# Patient Record
Sex: Male | Born: 1945 | Race: Black or African American | Hispanic: No | Marital: Married | Smoking: Former smoker
Health system: Southern US, Community
[De-identification: ages and names within clinical notes are randomized; demographics above are authoritative.]

## PROBLEM LIST (undated history)

## (undated) DIAGNOSIS — I2510 Atherosclerotic heart disease of native coronary artery without angina pectoris: Secondary | ICD-10-CM

## (undated) DIAGNOSIS — M1990 Unspecified osteoarthritis, unspecified site: Secondary | ICD-10-CM

## (undated) DIAGNOSIS — I6529 Occlusion and stenosis of unspecified carotid artery: Secondary | ICD-10-CM

## (undated) DIAGNOSIS — I10 Essential (primary) hypertension: Secondary | ICD-10-CM

## (undated) DIAGNOSIS — E119 Type 2 diabetes mellitus without complications: Secondary | ICD-10-CM

## (undated) DIAGNOSIS — K219 Gastro-esophageal reflux disease without esophagitis: Secondary | ICD-10-CM

## (undated) DIAGNOSIS — M545 Low back pain, unspecified: Secondary | ICD-10-CM

## (undated) DIAGNOSIS — I219 Acute myocardial infarction, unspecified: Secondary | ICD-10-CM

## (undated) DIAGNOSIS — E7800 Pure hypercholesterolemia, unspecified: Secondary | ICD-10-CM

## (undated) DIAGNOSIS — G8929 Other chronic pain: Secondary | ICD-10-CM

## (undated) HISTORY — DX: Occlusion and stenosis of unspecified carotid artery: I65.29

## (undated) SURGERY — Surgical Case
Anesthesia: *Unknown

---

## 1999-02-13 ENCOUNTER — Ambulatory Visit (HOSPITAL_COMMUNITY): Admission: RE | Admit: 1999-02-13 | Discharge: 1999-02-13 | Payer: Self-pay | Admitting: Cardiology

## 1999-02-13 ENCOUNTER — Encounter: Payer: Self-pay | Admitting: Cardiology

## 2003-04-25 ENCOUNTER — Encounter: Payer: Self-pay | Admitting: Emergency Medicine

## 2003-04-25 ENCOUNTER — Emergency Department (HOSPITAL_COMMUNITY): Admission: EM | Admit: 2003-04-25 | Discharge: 2003-04-25 | Payer: Self-pay | Admitting: Emergency Medicine

## 2003-04-27 ENCOUNTER — Encounter: Payer: Self-pay | Admitting: Cardiology

## 2003-04-27 ENCOUNTER — Encounter: Admission: RE | Admit: 2003-04-27 | Discharge: 2003-04-27 | Payer: Self-pay | Admitting: Cardiology

## 2004-07-04 ENCOUNTER — Encounter: Admission: RE | Admit: 2004-07-04 | Discharge: 2004-07-04 | Payer: Self-pay | Admitting: Cardiology

## 2005-04-24 ENCOUNTER — Ambulatory Visit (HOSPITAL_COMMUNITY): Admission: RE | Admit: 2005-04-24 | Discharge: 2005-04-24 | Payer: Self-pay | Admitting: Cardiology

## 2005-12-05 ENCOUNTER — Encounter: Admission: RE | Admit: 2005-12-05 | Discharge: 2005-12-05 | Payer: Self-pay | Admitting: Internal Medicine

## 2006-07-07 DIAGNOSIS — I219 Acute myocardial infarction, unspecified: Secondary | ICD-10-CM

## 2006-07-07 HISTORY — PX: CORONARY ANGIOPLASTY WITH STENT PLACEMENT: SHX49

## 2006-07-07 HISTORY — DX: Acute myocardial infarction, unspecified: I21.9

## 2006-10-28 ENCOUNTER — Encounter: Admission: RE | Admit: 2006-10-28 | Discharge: 2006-10-28 | Payer: Self-pay | Admitting: Internal Medicine

## 2006-12-25 ENCOUNTER — Ambulatory Visit (HOSPITAL_COMMUNITY): Admission: RE | Admit: 2006-12-25 | Discharge: 2006-12-26 | Payer: Self-pay | Admitting: Cardiovascular Disease

## 2010-11-19 NOTE — Discharge Summary (Signed)
NAME:  Austin Mclaughlin, Austin Mclaughlin NO.:  [id number]   MEDICAL RECORD NO.:  [id number]          PATIENT TYPE:  OIB   LOCATION:  6531                         FACILITY:  [facility]   PHYSICIAN:  Nanetta Batty, M.D.   DATE OF BIRTH:  August 02, 1945   DATE OF ADMISSION:  12/25/2006  DATE OF DISCHARGE:  12/26/2006                               DISCHARGE SUMMARY   DISCHARGE DIAGNOSES:  1. Abnormal Cardiolite study, coronary disease with elective obtuse      marginal-2 TAXUS stenting this admission.  2. Treated hypertension.  3. Non-insulin diabetes.  4. Dyslipidemia.   HOSPITAL COURSE:  The patient is a 65 year old male who had a positive  functional study in May 2008, after chest pain.  He has multiple risk  factors for coronary disease.  He was cathed as an outpatient on June  6th, which showed normal LV function, total PDA which filled by  collaterals, and high-grade OM-2 disease.  He was admitted on December 25, 2006, for elective OM-2 intervention.  This was done with a TAXUS stent  with good results.  He also had an AV groove intervention.  Dr. Allyson Sabal  feels he can be discharged December 26, 2006.   DISCHARGE MEDICATIONS:  1. Plavix 75 mg a day.  2. Aspirin 81 mg a day.  3. Simvastatin 40 mg a day.  4. Lisinopril/HCTZ 20/12.5 daily.  5. Actos 45 mg a day.  6. Lantus 10 units a day.  7. Nitroglycerin sublingual p.r.n.  8. He will start his glyburide/metformin 5/500, two tablets b.i.d. on      December 28, 2006.   LABORATORY:  White count 5.1, hemoglobin 12.1, hematocrit 35.1,  platelets 287.  Sodium 141, potassium 3.9, BUN 15, creatinine 0.9.  CK-  MB and troponin are negative x1.   DISPOSITION:  The patient is discharged in stable condition and will  follow up with Dr. Allyson Sabal as an outpatient.      Abelino Derrick, P.A.      Nanetta Batty, M.D.  Electronically Signed    LKK/MEDQ  D:  12/26/2006  T:  12/26/2006  Job:  [id number]

## 2010-11-19 NOTE — Discharge Summary (Signed)
NAME:  Austin Mclaughlin, Austin Mclaughlin                   ACCOUNT NO.:  [id number]   MEDICAL RECORD NO.:  [id number]          PATIENT TYPE:  OIB   LOCATION:  6531                         FACILITY:  [facility]   PHYSICIAN:  Abelino Derrick, P.A.   DATE OF BIRTH:  12/19/1945   DATE OF ADMISSION:  12/25/2006  DATE OF DISCHARGE:                               DISCHARGE SUMMARY   Audio too short to transcribe (less than 5 seconds)      Luke K. Kilroy, P.ALenard Lance  D:  12/26/2006  T:  12/26/2006  Job:  [id number]

## 2010-11-19 NOTE — Cardiovascular Report (Signed)
NAME:  TALIK, CASIQUE NO.:  [id number]   MEDICAL RECORD NO.:  [id number]          PATIENT TYPE:  OIB   LOCATION:  6531                         FACILITY:  [facility]   PHYSICIAN:  Nanetta Batty, M.D.   DATE OF BIRTH:  1945-08-22   DATE OF PROCEDURE:  DATE OF DISCHARGE:                            CARDIAC CATHETERIZATION   Mr. Harlin is a 65 year old mildly overweight African-American male with a  positive functional study, Nov 19, 2006, after an episode of chest  discomfort.  His other problems include recently discontinuing tobacco  abuse, hypertension, hyperlipidemia and diabetes.  His functional study  showed inferolateral ischemia.  Cath performed as an outpatient December 11, 2006 showed a left dominant system, normal LV function and an occluded  PDA with, collaterals, high-grade OM-2 disease and proximal segmental AV  groove circumflex disease.  He has been asymptomatic.  He presents now  for elective PCI for __________ ischemia.   DESCRIPTION OF PROCEDURE:  The patient was brought to the second floor  of [place] cardiac cath lab in post-absorptive state.  He was pulse  pre-medicated with p.o. Valium.   His right groin was prepped and shaved in the usual sterile fashion.  One percent Xylocaine was used for local anesthesia.  A 7-French sheath  was inserted into the right femoral artery, using standard Seldinger  technique.  A Doppler-tipped Smart needle was required.  The patient  received 4 baby aspirin.  He was on 75 mg of Plavix at home and received  an additional 300 mg of Plavix, prior intervention, as well as Angiomax  bolus with an ACT of approximately 300.   Using a 7-French XD 3.5 guide catheter, along with an OM4 190 Asahi soft  wire, a 2.5 15 Maverick PTCA was performed of the proximal OM-2.  It  should be noted that the 2-0 cutting balloon was unable to cross this  lesion, nor was a 2.5 stent prior to dilatation with a 2.5 balloon.  After dilatation  with 2.5 15 Maverick, a 2.5 16 Taxus drug-eluting stent  was then deployed under careful angiographic and fluoroscopic control at  the ostium of OM-2, at 12 atmospheres (2.7 mm), resulting in reduction  of 95% stenosis with 0% residual, without dissection.  There was TIMI-  III flow.  Intracoronary nitroglycerin 200 mg was administered.  Following this, direct stenting was performed of the proximal segmental  AV groove circumflex, with 3-0 20 Taxus at 16 atmospheres (3.34),  resulting in reduction of a long 70 to 80% stenosis to a 0% residual.  The patient tolerated the procedure well.  There was subtle ST-segment  elevation with balloon inflation, which resolved quickly with balloon  deflation.  There are no hemodynamic or arrhythmogenic sequela.   OVERALL IMPRESSION:  Successful PCI and stenting of the AV groove  circumflex and OM II using Taxus drug-eluting stent and Angiomax.  The  patient tolerated the procedure well.  Guidewire and catheter removed.  The sheath  was sewn securely in place.  The patient left the lab in stable  condition.  The  sheath will be removed in 2 hours.  The patient will be  discharged home the morning with aspirin and Plavix, will see me back in  the office in 1 or 2 weeks at followup.  Dr. Jacqulyn Bath office was  notified.      Nanetta Batty, M.D.  Electronically Signed     JB/MEDQ  D:  12/25/2006  T:  12/25/2006  Job:  [id number]   cc:   Patient Chart  [facility] - 2nd Floor  [facility]  Ralene Ok, M.D.

## 2011-04-23 LAB — CBC
HCT: 35.1 — ABNORMAL LOW
Hemoglobin: 12 — ABNORMAL LOW
MCHC: 34.1
MCV: 91.5
Platelets: 287
RBC: 3.84 — ABNORMAL LOW
RDW: 13.2
WBC: 5.1

## 2011-04-23 LAB — BASIC METABOLIC PANEL
BUN: 15
CO2: 25
Calcium: 8.9
Chloride: 108
Creatinine, Ser: 0.93
GFR calc Af Amer: >60
GFR calc non Af Amer: >60
Glucose, Bld: 157 — ABNORMAL HIGH
Potassium: 3.9
Sodium: 141

## 2011-04-23 LAB — TROPONIN I: Troponin I: 0.03

## 2011-04-23 LAB — CK TOTAL AND CKMB (NOT AT ARMC)
CK, MB: 3.1
Relative Index: 1.3
Total CK: 248 — ABNORMAL HIGH

## 2013-02-15 ENCOUNTER — Inpatient Hospital Stay (HOSPITAL_COMMUNITY)
Admission: EM | Admit: 2013-02-15 | Discharge: 2013-02-17 | DRG: 282 | Disposition: A | Discharge status: Home / Self Care | Payer: Medicare Other | Attending: Cardiovascular Disease | Admitting: Cardiovascular Disease

## 2013-02-15 ENCOUNTER — Encounter (HOSPITAL_COMMUNITY): Payer: Self-pay

## 2013-02-15 ENCOUNTER — Emergency Department (HOSPITAL_COMMUNITY): Payer: Medicare Other

## 2013-02-15 DIAGNOSIS — I213 ST elevation (STEMI) myocardial infarction of unspecified site: Secondary | ICD-10-CM

## 2013-02-15 DIAGNOSIS — E119 Type 2 diabetes mellitus without complications: Secondary | ICD-10-CM | POA: Diagnosis present

## 2013-02-15 DIAGNOSIS — Z720 Tobacco use: Secondary | ICD-10-CM | POA: Diagnosis present

## 2013-02-15 DIAGNOSIS — R079 Chest pain, unspecified: Secondary | ICD-10-CM | POA: Diagnosis present

## 2013-02-15 DIAGNOSIS — I2109 ST elevation (STEMI) myocardial infarction involving other coronary artery of anterior wall: Principal | ICD-10-CM | POA: Diagnosis present

## 2013-02-15 DIAGNOSIS — I2510 Atherosclerotic heart disease of native coronary artery without angina pectoris: Secondary | ICD-10-CM | POA: Diagnosis present

## 2013-02-15 DIAGNOSIS — F17200 Nicotine dependence, unspecified, uncomplicated: Secondary | ICD-10-CM | POA: Diagnosis present

## 2013-02-15 DIAGNOSIS — Z9861 Coronary angioplasty status: Secondary | ICD-10-CM

## 2013-02-15 DIAGNOSIS — I10 Essential (primary) hypertension: Secondary | ICD-10-CM | POA: Diagnosis present

## 2013-02-15 HISTORY — DX: Other chronic pain: G89.29

## 2013-02-15 HISTORY — DX: Low back pain: M54.5

## 2013-02-15 HISTORY — DX: Essential (primary) hypertension: I10

## 2013-02-15 HISTORY — DX: Acute myocardial infarction, unspecified: I21.9

## 2013-02-15 HISTORY — DX: Pure hypercholesterolemia, unspecified: E78.00

## 2013-02-15 HISTORY — DX: Low back pain, unspecified: M54.50

## 2013-02-15 HISTORY — PX: CARDIAC CATHETERIZATION: SHX172

## 2013-02-15 HISTORY — DX: Gastro-esophageal reflux disease without esophagitis: K21.9

## 2013-02-15 HISTORY — DX: Unspecified osteoarthritis, unspecified site: M19.90

## 2013-02-15 HISTORY — DX: Type 2 diabetes mellitus without complications: E11.9

## 2013-02-15 HISTORY — DX: Atherosclerotic heart disease of native coronary artery without angina pectoris: I25.10

## 2013-02-15 LAB — POCT I-STAT TROPONIN I: Troponin i, poc: 0.02 ng/mL (ref 0.00–0.08)

## 2013-02-15 LAB — CBC
HCT: 37.5 % — ABNORMAL LOW (ref 39.0–52.0)
Hemoglobin: 13.4 g/dL (ref 13.0–17.0)
MCH: 32.1 pg (ref 26.0–34.0)
MCV: 89.7 fL (ref 78.0–100.0)
Platelets: 280 10*3/uL (ref 150–400)
RBC: 4.18 MIL/uL — ABNORMAL LOW (ref 4.22–5.81)
RDW: 12.3 % (ref 11.5–15.5)
WBC: 5.3 10*3/uL (ref 4.0–10.5)

## 2013-02-15 LAB — BASIC METABOLIC PANEL
CO2: 28 mEq/L (ref 19–32)
Calcium: 9.9 mg/dL (ref 8.4–10.5)
Chloride: 95 mEq/L — ABNORMAL LOW (ref 96–112)
Creatinine, Ser: 1.24 mg/dL (ref 0.50–1.35)
GFR calc Af Amer: 67 mL/min — ABNORMAL LOW (ref >90)
GFR calc non Af Amer: 58 mL/min — ABNORMAL LOW (ref >90)
Glucose, Bld: 236 mg/dL — ABNORMAL HIGH (ref 70–99)
Potassium: 3.4 mEq/L — ABNORMAL LOW (ref 3.5–5.1)
Sodium: 133 mEq/L — ABNORMAL LOW (ref 135–145)

## 2013-02-15 SURGERY — LEFT HEART CATHETERIZATION WITH CORONARY ANGIOGRAM
Anesthesia: LOCAL

## 2013-02-15 MED ORDER — METOPROLOL TARTRATE 1 MG/ML IV SOLN
2.5000 mg | Freq: Once | INTRAVENOUS | Status: AC
  Administered 2013-02-15: 2.5 mg via INTRAVENOUS

## 2013-02-15 MED ORDER — HEPARIN SODIUM (PORCINE) 5000 UNIT/ML IJ SOLN
INTRAMUSCULAR | Status: AC
  Administered 2013-02-15: 5000 [IU]
  Filled 2013-02-15: qty 1

## 2013-02-15 MED ORDER — ASPIRIN 81 MG PO CHEW
324.0000 mg | CHEWABLE_TABLET | Freq: Once | ORAL | Status: DC
  Filled 2013-02-15: qty 4

## 2013-02-15 MED ORDER — POTASSIUM CHLORIDE CRYS ER 20 MEQ PO TBCR
40.0000 meq | EXTENDED_RELEASE_TABLET | Freq: Once | ORAL | Status: AC
  Administered 2013-02-16: 40 meq via ORAL

## 2013-02-15 MED ORDER — DILTIAZEM HCL 25 MG/5ML IV SOLN: 15.0000 mg | Freq: Once | INTRAVENOUS | Status: DC

## 2013-02-15 MED ORDER — METOPROLOL TARTRATE 1 MG/ML IV SOLN
INTRAVENOUS | Status: AC
  Filled 2013-02-15: qty 15

## 2013-02-15 MED ORDER — ASPIRIN 325 MG PO TABS
325.0000 mg | ORAL_TABLET | ORAL | Status: AC
  Administered 2013-02-15: 325 mg via ORAL

## 2013-02-15 NOTE — ED Notes (Signed)
Pt presents with no complaints of pain from [facility] via [facility]

## 2013-02-15 NOTE — ED Notes (Signed)
Pt reports an onset of chest pain tonight located on the L side that he describes as sharp. Non radiating. Pt denies jaw pain, neck pain or arm pain. Hx of 2 CABG. Pain to chest area is intermittent. VS stable. NSR on monitor.

## 2013-02-15 NOTE — H&P (Signed)
Admit date: 02/15/2013 Referring Physician Dr. Rulon Abide Primary Cardiologist Dr. Allyson Sabal Chief complaint/reason for admission: Chest pain  HPI: This is a 67yo AAM with a history of CAD s/p cath in 2008 after a stress test for CP showed ischemia.  Cath with occluded PDA and collaterals with high grade OM2 disease and proximal segmental AV groove left circ disease.  He underwent PCI of the AV groove left circ and OM2 with DES.  He has done well since then until yesterday when he started having CP.  The CP has been intermittent for the past 24 hours and then today he developed recurrent pain and decided to go to the ER.  He described the pain as a sharp stinging pain in his left chest with no SOB, nausea or diaphoresis. At [facility] ER he was noted to have 4mm of ST elevation in V1 and V2 and a STEMI was called and he was transferred to [facility].  He currently is pain free.    PMH:    Past Medical History  Diagnosis Date  . Hypertension   . Coronary artery disease with cath 2008 with occluded PDA with collaterals and high grade OM2 disease and proximal segmental AV groove left circ disease s/p DES to AV groove left circ and OM2   . Diabetes mellitus without complication     PSH:    Past Surgical History  Procedure Laterality Date  . Coronary artery bypass graft      ALLERGIES:   Review of patient's allergies indicates not on file.  Prior to Admit Meds:   (Not in a hospital admission) Family HX:   No family history on file. Social HX:    History   Social History  . Marital Status: Married    Spouse Name: [name]    Number of Children: [name]  . Years of Education: [name]   Occupational History  . Not on file.   Social History Main Topics  . Smoking status: Current Every Day Smoker    Types: Cigarettes  . Smokeless tobacco: Not on file  . Alcohol Use: Not on file  . Drug Use: No  . Sexually Active: Not on file   Other Topics Concern  . Not on file   Social History Narrative  . No narrative on file      ROS:  All 11 ROS were addressed and are negative except what is stated in the HPI  PHYSICAL EXAM Filed Vitals:   02/15/13 2331  Pulse: 93  Temp: 99.1 F (37.3 C)  Resp: 17   General: Well developed, well nourished, in no acute distress Head: Eyes PERRLA, No xanthomas.   Normal cephalic and atramatic  Lungs:   Clear bilaterally to auscultation and percussion. Heart:   HRRR S1 S2 Pulses are 2+ & equal.            No carotid bruit. No JVD.  No abdominal bruits. No femoral bruits. Abdomen: Bowel sounds are positive, abdomen soft and non-tender without masses Extremities:   No clubbing, cyanosis or edema.  DP trace Neuro: Alert and oriented X 3. Psych:  Good affect, responds appropriately   Labs:   Lab Results  Component Value Date   WBC 5.3 02/15/2013   HGB 13.4 02/15/2013   HCT 37.5* 02/15/2013   MCV 89.7 02/15/2013   PLT 280 02/15/2013    Recent Labs Lab 02/15/13 2314  NA 133*  K 3.4*  CL 95*  CO2 28  BUN 16  CREATININE 1.24  CALCIUM  9.9  GLUCOSE 236*   Lab Results  Component Value Date   CKTOTAL 248* 12/25/2006   CKMB 3.1 12/25/2006   TROPONINI  Value: 0.03        NO INDICATION OF MYOCARDIAL INJURY. 12/25/2006       Radiology:  @[id number] @  EKG:  NSR with 2-68mm of ST elevation in V2 and 1mm in V1  ASSESSMENT:  1.  STEMI with EKG showing ST elevation in V1 and V2 - he has had stuttering CP for the past 24 hours off and on. 2.  CAD with remote PCI of the left circ and OM2 3.  DM 4.  HTN with poorly controlled BP  PLAN:   1.  Admit to CCU 2.  Cycle cardiac enzymes 3.  IV Heparin gtt/ASA/statin 4.  Lopressor 5mg  IV given in ER and will continue PO 5.  Emergent cath by Dr. Rosamaria Lints, MD  02/15/2013  11:50 PM

## 2013-02-15 NOTE — ED Notes (Signed)
EKG shown to Cardiologist at 23:47

## 2013-02-16 ENCOUNTER — Ambulatory Visit (HOSPITAL_COMMUNITY): Admit: 2013-02-16 | Payer: Self-pay | Admitting: Cardiovascular Disease

## 2013-02-16 ENCOUNTER — Encounter (HOSPITAL_COMMUNITY): Payer: Self-pay | Admitting: General Practice

## 2013-02-16 DIAGNOSIS — I2510 Atherosclerotic heart disease of native coronary artery without angina pectoris: Secondary | ICD-10-CM | POA: Diagnosis present

## 2013-02-16 DIAGNOSIS — I10 Essential (primary) hypertension: Secondary | ICD-10-CM | POA: Diagnosis present

## 2013-02-16 DIAGNOSIS — Z720 Tobacco use: Secondary | ICD-10-CM | POA: Diagnosis present

## 2013-02-16 DIAGNOSIS — E119 Type 2 diabetes mellitus without complications: Secondary | ICD-10-CM | POA: Diagnosis present

## 2013-02-16 DIAGNOSIS — R079 Chest pain, unspecified: Secondary | ICD-10-CM | POA: Diagnosis present

## 2013-02-16 DIAGNOSIS — I214 Non-ST elevation (NSTEMI) myocardial infarction: Secondary | ICD-10-CM

## 2013-02-16 LAB — POCT I-STAT TROPONIN I: Troponin i, poc: 0.01 ng/mL (ref 0.00–0.08)

## 2013-02-16 LAB — CBC WITH DIFFERENTIAL/PLATELET
Eosinophils Absolute: 0.1 10*3/uL (ref 0.0–0.7)
Eosinophils Relative: 2 % (ref 0–5)
HCT: 34.8 % — ABNORMAL LOW (ref 39.0–52.0)
Lymphs Abs: 2.1 10*3/uL (ref 0.7–4.0)
MCH: 32.1 pg (ref 26.0–34.0)
MCV: 89.5 fL (ref 78.0–100.0)
Monocytes Absolute: 0.4 10*3/uL (ref 0.1–1.0)
Platelets: 261 10*3/uL (ref 150–400)
RBC: 3.89 MIL/uL — ABNORMAL LOW (ref 4.22–5.81)

## 2013-02-16 LAB — LIPID PANEL
Cholesterol: 185 mg/dL (ref 0–200)
LDL Cholesterol: 111 mg/dL — ABNORMAL HIGH (ref 0–99)
VLDL: 34 mg/dL (ref 0–40)

## 2013-02-16 LAB — MRSA PCR SCREENING: MRSA by PCR: NEGATIVE

## 2013-02-16 LAB — POCT ACTIVATED CLOTTING TIME
Activated Clotting Time: 140 seconds
Activated Clotting Time: 191 seconds

## 2013-02-16 LAB — TROPONIN I
Troponin I: <0.3 ng/mL (ref <0.30)
Troponin I: <0.3 ng/mL (ref <0.30)

## 2013-02-16 LAB — BASIC METABOLIC PANEL
Calcium: 9 mg/dL (ref 8.4–10.5)
Creatinine, Ser: 0.99 mg/dL (ref 0.50–1.35)
GFR calc Af Amer: >90 mL/min (ref >90)
GFR calc non Af Amer: 82 mL/min — ABNORMAL LOW (ref >90)

## 2013-02-16 LAB — GLUCOSE, CAPILLARY
Glucose-Capillary: 241 mg/dL — ABNORMAL HIGH (ref 70–99)
Glucose-Capillary: 168 mg/dL — ABNORMAL HIGH (ref 70–99)
Glucose-Capillary: 265 mg/dL — ABNORMAL HIGH (ref 70–99)

## 2013-02-16 LAB — PROTIME-INR: Prothrombin Time: 12.7 seconds (ref 11.6–15.2)

## 2013-02-16 LAB — HEMOGLOBIN A1C: Mean Plasma Glucose: 197 mg/dL — ABNORMAL HIGH (ref <117)

## 2013-02-16 MED ORDER — HYDRALAZINE HCL 20 MG/ML IJ SOLN
INTRAMUSCULAR | Status: AC
  Filled 2013-02-16: qty 1

## 2013-02-16 MED ORDER — POTASSIUM CHLORIDE CRYS ER 20 MEQ PO TBCR
EXTENDED_RELEASE_TABLET | ORAL | Status: AC
  Administered 2013-02-16: 40 meq via ORAL
  Filled 2013-02-16: qty 2

## 2013-02-16 MED ORDER — HEPARIN (PORCINE) IN NACL 2-0.9 UNIT/ML-% IJ SOLN
INTRAMUSCULAR | Status: AC
  Filled 2013-02-16: qty 1000

## 2013-02-16 MED ORDER — LISINOPRIL 20 MG PO TABS
20.0000 mg | ORAL_TABLET | Freq: Every day | ORAL | Status: DC
  Administered 2013-02-16 – 2013-02-17 (×2): 20 mg via ORAL
  Filled 2013-02-16 (×2): qty 1

## 2013-02-16 MED ORDER — ASPIRIN EC 81 MG PO TBEC
81.0000 mg | DELAYED_RELEASE_TABLET | Freq: Every day | ORAL | Status: DC
  Administered 2013-02-16 – 2013-02-17 (×2): 81 mg via ORAL
  Filled 2013-02-16 (×2): qty 1

## 2013-02-16 MED ORDER — HYDRALAZINE HCL 20 MG/ML IJ SOLN
10.0000 mg | INTRAMUSCULAR | Status: DC | PRN
  Administered 2013-02-16: 10 mg via INTRAVENOUS
  Filled 2013-02-16 (×2): qty 1

## 2013-02-16 MED ORDER — METOPROLOL TARTRATE 25 MG PO TABS
25.0000 mg | ORAL_TABLET | Freq: Two times a day (BID) | ORAL | Status: DC
  Administered 2013-02-16 – 2013-02-17 (×3): 25 mg via ORAL
  Filled 2013-02-16 (×5): qty 1

## 2013-02-16 MED ORDER — ACETAMINOPHEN 325 MG PO TABS: 650.0000 mg | ORAL_TABLET | ORAL | Status: DC | PRN

## 2013-02-16 MED ORDER — ATROPINE SULFATE 1 MG/ML IJ SOLN
INTRAMUSCULAR | Status: AC
  Filled 2013-02-16: qty 1

## 2013-02-16 MED ORDER — NITROGLYCERIN IN D5W 200-5 MCG/ML-% IV SOLN
INTRAVENOUS | Status: AC
  Filled 2013-02-16: qty 250

## 2013-02-16 MED ORDER — ASPIRIN EC 81 MG PO TBEC: 81.0000 mg | DELAYED_RELEASE_TABLET | Freq: Every day | ORAL | Status: DC

## 2013-02-16 MED ORDER — NITROGLYCERIN 0.2 MG/ML ON CALL CATH LAB
INTRAVENOUS | Status: AC
  Filled 2013-02-16: qty 1

## 2013-02-16 MED ORDER — NITROGLYCERIN 0.4 MG SL SUBL: 0.4000 mg | SUBLINGUAL_TABLET | SUBLINGUAL | Status: DC | PRN

## 2013-02-16 MED ORDER — LIDOCAINE HCL (PF) 1 % IJ SOLN
INTRAMUSCULAR | Status: AC
  Filled 2013-02-16: qty 30

## 2013-02-16 MED ORDER — NITROGLYCERIN IN D5W 200-5 MCG/ML-% IV SOLN: 2.0000 ug/min | INTRAVENOUS | Status: DC

## 2013-02-16 MED ORDER — ONDANSETRON HCL 4 MG/2ML IJ SOLN: 4.0000 mg | Freq: Four times a day (QID) | INTRAMUSCULAR | Status: DC | PRN

## 2013-02-16 MED ORDER — HYDRALAZINE HCL 20 MG/ML IJ SOLN
10.0000 mg | Freq: Once | INTRAMUSCULAR | Status: AC
  Administered 2013-02-16: 10 mg via INTRAVENOUS

## 2013-02-16 MED ORDER — ATORVASTATIN CALCIUM 40 MG PO TABS
40.0000 mg | ORAL_TABLET | Freq: Every day | ORAL | Status: DC
  Administered 2013-02-16: 40 mg via ORAL
  Filled 2013-02-16 (×3): qty 1

## 2013-02-16 MED ORDER — INSULIN ASPART 100 UNIT/ML ~~LOC~~ SOLN
0.0000 [IU] | Freq: Three times a day (TID) | SUBCUTANEOUS | Status: DC
  Administered 2013-02-16: 3 [IU] via SUBCUTANEOUS
  Administered 2013-02-16: 8 [IU] via SUBCUTANEOUS
  Administered 2013-02-16: 3 [IU] via SUBCUTANEOUS
  Administered 2013-02-17 (×2): 5 [IU] via SUBCUTANEOUS

## 2013-02-16 MED ORDER — SODIUM CHLORIDE 0.45 % IV SOLN
INTRAVENOUS | Status: DC
  Administered 2013-02-16: 01:00:00 via INTRAVENOUS

## 2013-02-16 MED ORDER — CLOPIDOGREL BISULFATE 75 MG PO TABS
75.0000 mg | ORAL_TABLET | Freq: Every day | ORAL | Status: DC
  Administered 2013-02-16 – 2013-02-17 (×2): 75 mg via ORAL
  Filled 2013-02-16 (×3): qty 1

## 2013-02-16 NOTE — Progress Notes (Signed)
Pt. Seen and examined. Agree with the NP/PA-C note as written. He is chest pain free now, but on nitroglycerin gtts. Will try to wean that today. Will need to uptitrate his anti-hypertensive medication. Would not walk with rehab today, since he presented last pm. Troponins have been negative thus far.  Chrystie Nose, MD, [facility] Attending Cardiologist [facility]

## 2013-02-16 NOTE — ED Provider Notes (Signed)
CSN: [id number]     Arrival date & time 02/15/13  2259 History     First MD Initiated Contact with Patient 02/15/13 2328     Chief Complaint  Patient presents with  . Chest Pain   (Consider location/radiation/quality/duration/timing/severity/associated sxs/prior Treatment) Patient is a 67 y.o. male presenting with chest pain.  Chest Pain  Austin Mclaughlin is a 67 y.o. male history of hypertension, hyperlipidemia, diabetes and history of coronary artery disease status post catheterization in 2008 after positive stress test showing an occluded PDA collaterals he did undergo PCI at that time and has since been medically managed. Patient started having chest pain yesterday afternoon and thought it was initially indigestion, he says it intermittent, sharp, located centrally in the chest and to the left of the sternum. Is not associated with diaphoresis,, no shortness of breath, no radiation, no vomiting.  Patient continues to have chest pain now although it comes and goes. Patient is taking his home medicine without relief.  Past Medical History  Diagnosis Date  . Hypertension   . Coronary artery disease   . Diabetes mellitus without complication   . Myocardial infarction    Past Surgical History  Procedure Laterality Date  . Coronary artery bypass graft     History reviewed. No pertinent family history. History  Substance Use Topics  . Smoking status: Current Every Day Smoker    Types: Cigarettes  . Smokeless tobacco: Not on file  . Alcohol Use: Not on file    Review of Systems  Cardiovascular: Positive for chest pain.  At least 10pt or greater review of systems completed and are negative except where specified in the HPI.   Allergies  Review of patient's allergies indicates no known allergies.  Home Medications  No current outpatient prescriptions on file. BP 133/58  Pulse 70  Temp(Src) 98.6 F (37 C) (Oral)  Resp 15  Ht 5\' 8"  (1.727 m)  Wt 183 lb 3.2 oz (83.1 kg)  BMI  27.86 kg/m2  SpO2 98% Physical Exam  Nursing notes reviewed.  Electronic medical record reviewed. VITAL SIGNS:   Filed Vitals:   02/16/13 0409 02/16/13 0500 02/16/13 0600 02/16/13 0730  BP:   133/58   Pulse:  70 70   Temp: 98.7 F (37.1 C)   98.6 F (37 C)  TempSrc: Oral   Oral  Resp:  15 15   Height:      Weight:      SpO2:  98% 97% 98%   CONSTITUTIONAL: Awake, oriented, appears non-toxic HENT: Atraumatic, normocephalic, oral mucosa pink and moist, airway patent. Nares patent without drainage. External ears normal. EYES: Conjunctiva clear, EOMI, PERRLA NECK: Trachea midline, non-tender, supple CARDIOVASCULAR: Normal heart rate, Normal rhythm, No murmurs, rubs, gallops PULMONARY/CHEST: Clear to auscultation, no rhonchi, wheezes, or rales. Symmetrical breath sounds. Non-tender. ABDOMINAL: Non-distended, soft, non-tender - no rebound or guarding.  BS normal. NEUROLOGIC: Non-focal, moving all four extremities, no gross sensory or motor deficits. EXTREMITIES: No clubbing, cyanosis, or edema SKIN: Warm, Dry, No erythema, No rash  ED Course   Procedures (including critical care time)  Date: 02/16/2013  Rate: 92  Rhythm: normal sinus rhythm  QRS Axis: normal  Intervals: normal  ST/T Wave abnormalities: normal  Conduction Disutrbances: none  Narrative Interpretation: ST elevation in V1 and V2; no prior for comparison, abnormal EKG, no obvious reciprocal depression; discussed this EKG with Dr. Allyson Sabal  Labs Reviewed  CBC - Abnormal; Notable for the following:    RBC 4.18 (*)  HCT 37.5 (*)    All other components within normal limits  BASIC METABOLIC PANEL - Abnormal; Notable for the following:    Sodium 133 (*)    Potassium 3.4 (*)    Chloride 95 (*)    Glucose, Bld 236 (*)    GFR calc non Af Amer 58 (*)    GFR calc Af Amer 67 (*)    All other components within normal limits  CBC WITH DIFFERENTIAL - Abnormal; Notable for the following:    RBC 3.89 (*)    Hemoglobin  12.5 (*)    HCT 34.8 (*)    All other components within normal limits  LIPID PANEL - Abnormal; Notable for the following:    Triglycerides 171 (*)    LDL Cholesterol 111 (*)    All other components within normal limits  BASIC METABOLIC PANEL - Abnormal; Notable for the following:    Glucose, Bld 226 (*)    GFR calc non Af Amer 82 (*)    All other components within normal limits  MRSA PCR SCREENING  TROPONIN I  PROTIME-INR  APTT  HEMOGLOBIN A1C  TROPONIN I  TROPONIN I  OCCULT BLOOD X 1 CARD TO LAB, STOOL  POCT I-STAT TROPONIN I  POCT I-STAT TROPONIN I  POCT ACTIVATED CLOTTING TIME   Dg Chest Port 1 View  02/15/2013   *RADIOLOGY REPORT*  Clinical Data: Chest pain.  PORTABLE CHEST - 1 VIEW  Comparison: Chest x-ray 10/28/2006.  Findings: Lung volumes are normal.  No consolidative airspace disease.  No pleural effusions.  No pneumothorax.  No pulmonary nodule or mass noted.  Pulmonary vasculature and the cardiomediastinal silhouette are within normal limits. Atherosclerosis in the thoracic aorta.  IMPRESSION: 1. No radiographic evidence of acute cardiopulmonary disease. 2.  Atherosclerosis.   Original Report Authenticated By: Trudie Reed, M.D.   1. Chest pain   2. STEMI (ST elevation myocardial infarction)     MDM  Austin Mclaughlin is a 67 y.o. male with history of coronary artery disease, he is status post PCI in 2008 mildly nauseous not have another EKG in our system. Patient presents with about 12 hours worth of chest pain, which is sharp however he does have an abnormal EKG with ST elevation in V1 and V2. Given the patient's risk factors, known coronary artery disease, prior PCI and abnormal EKG transfer this patient to [facility] cone for emergent catheterization by Dr. Allyson Sabal.   Jones Skene, MD 02/16/13 [phone]

## 2013-02-16 NOTE — Care Management Note (Signed)
    Page 1 of 1   02/16/2013     8:46:14 AM   CARE MANAGEMENT NOTE 02/16/2013  Patient:  Austin Mclaughlin, Austin Mclaughlin   Account Number:  [id number]  Date Initiated:  02/16/2013  Documentation initiated by:  Junius Creamer  Subjective/Objective Assessment:   adm w mi     Action/Plan:   lives w wife   Anticipated DC Date:     Anticipated DC Plan:        DC Planning Services  CM consult      Choice offered to / List presented to:             Status of service:   Medicare Important Message given?   (If response is "NO", the following Medicare IM given date fields will be blank) Date Medicare IM given:   Date Additional Medicare IM given:    Discharge Disposition:    Per UR Regulation:  Reviewed for med. necessity/level of care/duration of stay  If discussed at Long Length of Stay Meetings, dates discussed:    Comments:

## 2013-02-16 NOTE — Progress Notes (Signed)
Subjective: No further CP.  Objective: Vital signs in last 24 hours: Temp:  [98.4 F (36.9 C)-99.1 F (37.3 C)] 98.6 F (37 C) (08/13 0730) Pulse Rate:  [70-98] 70 (08/13 0600) Resp:  [12-18] 15 (08/13 0600) BP: (133-191)/(57-103) 133/58 mmHg (08/13 0600) SpO2:  [97 %-99 %] 98 % (08/13 0730) Weight:  [179 lb (81.194 kg)-183 lb 3.2 oz (83.1 kg)] 183 lb 3.2 oz (83.1 kg) (08/13 0100) Last BM Date: 02/15/13  Intake/Output from previous day: 08/12 0701 - 08/13 0700 In: 405 [I.V.:405] Out: 700 [Urine:700] Intake/Output this shift:    Medications Current Facility-Administered Medications  Medication Dose Route Frequency Provider Last Rate Last Dose  . 0.45 % sodium chloride infusion   Intravenous Continuous [name], MD 75 mL/hr at 02/16/13 0100    . acetaminophen (TYLENOL) tablet 650 mg  650 mg Oral Q4H PRN Quintella Reichert, MD      . aspirin EC tablet 81 mg  81 mg Oral Daily Quintella Reichert, MD      . atorvastatin (LIPITOR) tablet 40 mg  40 mg Oral q1800 Quintella Reichert, MD      . clopidogrel (PLAVIX) tablet 75 mg  75 mg Oral Q breakfast Quintella Reichert, MD      . hydrALAZINE (APRESOLINE) injection 10 mg  10 mg Intravenous Q4H PRN [name], MD      . insulin aspart (novoLOG) injection 0-15 Units  0-15 Units Subcutaneous TID WC Quintella Reichert, MD      . lisinopril (PRINIVIL,ZESTRIL) tablet 20 mg  20 mg Oral Daily Quintella Reichert, MD      . metoprolol tartrate (LOPRESSOR) tablet 25 mg  25 mg Oral BID Quintella Reichert, MD      . nitroGLYCERIN (NITROSTAT) SL tablet 0.4 mg  0.4 mg Sublingual Q5 Min x 3 PRN Quintella Reichert, MD      . nitroGLYCERIN 0.2 mg/mL in dextrose 5 % infusion  2-200 mcg/min Intravenous Titrated [name], MD 6 mL/hr at 02/16/13 0100 20 mcg/min at 02/16/13 0100  . ondansetron (ZOFRAN) injection 4 mg  4 mg Intravenous Q6H PRN Quintella Reichert, MD        PE: General appearance: alert, cooperative and no distress Lungs: clear to auscultation  bilaterally Heart: regular rate and rhythm, S1, S2 normal, no murmur, click, rub or gallop Extremities: No LEE Pulses: 2+ and symmetric 1+ DPs. Skin: No hematoma and nontender at right groin cath site.  Neurologic: Grossly normal  Lab Results:   Recent Labs  02/15/13 2314 02/16/13 0243  WBC 5.3 5.4  HGB 13.4 12.5*  HCT 37.5* 34.8*  PLT 280 261   BMET  Recent Labs  02/15/13 2314 02/16/13 0245  NA 133* 135  K 3.4* 3.6  CL 95* 100  CO2 28 28  GLUCOSE 236* 226*  BUN 16 18  CREATININE 1.24 0.99  CALCIUM 9.9 9.0   PT/INR  Recent Labs  02/16/13 0243  LABPROT 12.7  INR 0.97   Cholesterol  Recent Labs  02/16/13 0245  CHOL 185   Lipid Panel     Component Value Date/Time   CHOL 185 02/16/2013 0245   TRIG 171* 02/16/2013 0245   HDL 40 02/16/2013 0245   CHOLHDL 4.6 02/16/2013 0245   VLDL 34 02/16/2013 0245   LDLCALC 111* 02/16/2013 0245   Cardiac Panel (last 3 results)  Recent Labs  02/16/13 0243  TROPONINI <0.30      Assessment/Plan  Principal Problem:   Chest pain Active Problems:   CAD (coronary artery disease)   Tobacco abuse   HTN (hypertension)   DM type 2 (diabetes mellitus, type 2)  Plan:   SP LHC last night after a STEMI was called.  Patent LAD stent and diffuse RCA disease with medical treatment recommendation.  Continues to smoke.  IV nitro added for HTN.  Apparently he is compliant with plavix and not with htn meds.  TG 171 and LDL 111.  On lipitor.  Will increase.  We discussed dietary modification.  Low sodium and decrease carbohydrate intake.  Recommend one more night.  Wean off nitro.  Get lisinopril, lopressor in him and see how his BP responds.  Transfer to tele.  Troponin pending.   LOS: 1 day    Jeanann Balinski 02/16/2013 9:03 AM

## 2013-02-17 ENCOUNTER — Encounter (HOSPITAL_COMMUNITY): Payer: Self-pay | Admitting: General Practice

## 2013-02-17 DIAGNOSIS — I10 Essential (primary) hypertension: Secondary | ICD-10-CM

## 2013-02-17 DIAGNOSIS — I2510 Atherosclerotic heart disease of native coronary artery without angina pectoris: Secondary | ICD-10-CM

## 2013-02-17 DIAGNOSIS — R079 Chest pain, unspecified: Secondary | ICD-10-CM

## 2013-02-17 DIAGNOSIS — E119 Type 2 diabetes mellitus without complications: Secondary | ICD-10-CM

## 2013-02-17 LAB — GLUCOSE, CAPILLARY: Glucose-Capillary: 172 mg/dL — ABNORMAL HIGH (ref 70–99)

## 2013-02-17 MED ORDER — METOPROLOL TARTRATE 25 MG PO TABS: 25.0000 mg | ORAL_TABLET | Freq: Two times a day (BID) | ORAL | Status: DC

## 2013-02-17 MED ORDER — HYDROCHLOROTHIAZIDE 25 MG PO TABS
25.0000 mg | ORAL_TABLET | Freq: Every day | ORAL | Status: DC
  Administered 2013-02-17: 25 mg via ORAL
  Filled 2013-02-17: qty 1

## 2013-02-17 MED ORDER — LISINOPRIL-HYDROCHLOROTHIAZIDE 20-25 MG PO TABS: 1.0000 | ORAL_TABLET | Freq: Every day | ORAL | Status: DC

## 2013-02-17 MED ORDER — ASPIRIN 81 MG PO TBEC: 81.0000 mg | DELAYED_RELEASE_TABLET | Freq: Every day | ORAL | Status: AC

## 2013-02-17 MED ORDER — NITROGLYCERIN 0.4 MG SL SUBL: 0.4000 mg | SUBLINGUAL_TABLET | SUBLINGUAL | Status: AC | PRN

## 2013-02-17 NOTE — Progress Notes (Addendum)
Pt. Seen and examined. Agree with the NP/PA-C note as written.  Feeling much better. I agree with adding HCTZ for improved BP control. It would be best to RX for combination lisinopril/HCTZ 20/25 mg at discharge for compliance. If bp <140 systolic this afternoon, he will be okay for discharge. Follow-up in our office in 7-10 days with MLP.  Chrystie Nose, MD, [facility] Attending Cardiologist [facility]

## 2013-02-17 NOTE — Discharge Summary (Signed)
Physician Discharge Summary  Patient ID: Austin Mclaughlin MRN: [id number] DOB/AGE: [birth year] 67 y.o.  Admit date: 02/15/2013 Discharge date: 02/17/2013  Admission Diagnoses: Chest Pain  Discharge Diagnoses:  Principal Problem:   Chest pain Active Problems:   CAD (coronary artery disease)   Tobacco abuse   HTN (hypertension)   DM type 2 (diabetes mellitus, type 2)   Discharged Condition: stable  Hospital Course: The patient is a 67 y/o AAM with a history of CAD, s/p cath in 2008 after a stress test for CP showed ischemia. The cath, at that time, demonstrated an occluded PDA and collaterals with high grade OM2 disease and proximal segmental AV groove left circ disease. He underwent PCI of the AV groove left circ and OM2 with DES. He had reportedly done well until developing chest pain the day prior to arrival to the ED. The CP had been intermittent for over the course of 24 hours, before recurring and prompting the patient to seek evaluation. He described the pain as a sharp stinging pain in his left chest with no SOB, nausea or diaphoresis. At the [facility] ER he was noted to have 4mm of ST elevation in V1 and V2 and a STEMI was called and he was transferred to [facility]. On arrival to [facility], his chest pain resolved. He was taken urgently to the cath lab. The procedure was performed by Dr. Allyson Sabal via the right femoral artery. It demonstrated a patent LAD stent and diffuse RCA disease. Medical therapy was recommended. He left the cath lab in stable condition. Post-cath, he was noted to have moderate hypertension. He was placed on Lopressor, HCTZ and lisinopril. He had mild improvement in BP. He had no further chest pain. The right femoral access site remained stable, free of hematoma and bruit. He was last seen by Dr. Rennis Golden, who determined that he was stable for discharge home. He will follow up at [facility] on 03/03/13 with Wilburt Finlay, PA-C, to reassess blood pressure.   Consults: None  Significant Diagnostic  Studies:   LHC 02/15/13 Patent LAD stent and diffuse RCA disease with medical treatment recommendation  Treatments: See Hospital Course  Discharge Exam: Blood pressure 162/70, pulse 78, temperature 98.6 F (37 C), temperature source Oral, resp. rate 18, height 5\' 8"  (1.727 m), weight 183 lb 3.2 oz (83.1 kg), SpO2 100.00%.   Disposition: Home      Discharge Orders   Future Appointments Provider Department Dept Phone   03/03/2013 10:20 AM Wilburt Finlay, PA-C [facility] [place] [phone]   Future Orders Complete By Expires   Diet - low sodium heart healthy  As directed    Increase activity slowly  As directed        Medication List    STOP taking these medications       lisinopril-hydrochlorothiazide 20-12.5 MG per tablet  Commonly known as:  PRINZIDE,ZESTORETIC  Replaced by:  lisinopril-hydrochlorothiazide 20-25 MG per tablet      TAKE these medications       aspirin 81 MG EC tablet  Take 1 tablet (81 mg total) by mouth daily.     clopidogrel 75 MG tablet  Commonly known as:  PLAVIX  Take 75 mg by mouth daily.     glyBURIDE-metformin 5-500 MG per tablet  Commonly known as:  GLUCOVANCE  Take 1 tablet by mouth daily with breakfast.     insulin glargine 100 UNIT/ML injection  Commonly known as:  LANTUS  Inject 30 Units into the skin daily.  lisinopril-hydrochlorothiazide 20-25 MG per tablet  Commonly known as:  PRINZIDE,ZESTORETIC  Take 1 tablet by mouth daily.     metFORMIN 500 MG 24 hr tablet  Commonly known as:  GLUCOPHAGE-XR  Take 500 mg by mouth 2 (two) times daily.     metoprolol tartrate 25 MG tablet  Commonly known as:  LOPRESSOR  Take 1 tablet (25 mg total) by mouth 2 (two) times daily.     nisoldipine 40 MG 24 hr tablet  Commonly known as:  SULAR  Take 40 mg by mouth daily.     nitroGLYCERIN 0.4 MG SL tablet  Commonly known as:  NITROSTAT  Place 1 tablet (0.4 mg total) under the tongue every 5 (five) minutes x  3 doses as needed for chest pain.     pantoprazole 40 MG tablet  Commonly known as:  PROTONIX  Take 40 mg by mouth daily.     simvastatin 80 MG tablet  Commonly known as:  ZOCOR  Take 80 mg by mouth at bedtime.     Vitamin D (Ergocalciferol) 50000 UNITS Caps capsule  Commonly known as:  DRISDOL  Take 50,000 Units by mouth daily.       Follow-up Information   Follow up with HAGER, BRYAN, PA-C On 03/03/2013. (10:20 am)    Specialty:  Physician Assistant   Contact information:   [address] [place] [address] [phone]     TIME SPENT ON DISCHARGE, INCLUDING PHYSICIAN TIME: > 30 MINUTES  Signed: Allayne Butcher, PA-C 02/17/2013, 5:33 PM

## 2013-02-17 NOTE — Progress Notes (Signed)
Pt educated re: follow up appointments, new medications, etc. Pt and family had no questions at this time. Given discharge summary with appointments and education information. IV removed. Pt declined wheelchair and opted to walk out of hospital with family. All belongings returned to patient.   Delynn Flavin, RN

## 2013-02-17 NOTE — Progress Notes (Signed)
[facility]  Subjective: Denies further chest discomfort. No difficulty ambulating. No SOB, groin, back for flank pain.   Objective: Vital signs in last 24 hours: Temp:  [98 F (36.7 C)-99.1 F (37.3 C)] 99.1 F (37.3 C) (08/14 0512) Pulse Rate:  [72-93] 80 (08/14 0512) Resp:  [17-22] 18 (08/14 0512) BP: (138-177)/(53-83) 161/71 mmHg (08/14 0512) SpO2:  [95 %-100 %] 100 % (08/14 0512) Last BM Date: 02/15/13  Intake/Output from previous day: 08/13 0701 - 08/14 0700 In: 1378 [P.O.:1200; I.V.:178] Out: 2450 [Urine:2450] Intake/Output this shift: Total I/O In: 300 [P.O.:300] Out: -   Medications Current Facility-Administered Medications  Medication Dose Route Frequency Provider Last Rate Last Dose  . 0.45 % sodium chloride infusion   Intravenous Continuous [name], MD 75 mL/hr at 02/16/13 0100    . acetaminophen (TYLENOL) tablet 650 mg  650 mg Oral Q4H PRN Quintella Reichert, MD      . aspirin EC tablet 81 mg  81 mg Oral Daily Quintella Reichert, MD   81 mg at 02/16/13 0934  . atorvastatin (LIPITOR) tablet 40 mg  40 mg Oral q1800 Quintella Reichert, MD   40 mg at 02/16/13 1800  . clopidogrel (PLAVIX) tablet 75 mg  75 mg Oral Q breakfast Quintella Reichert, MD   75 mg at 02/16/13 0900  . hydrALAZINE (APRESOLINE) injection 10 mg  10 mg Intravenous Q4H PRN [name], MD   10 mg at 02/16/13 2007  . insulin aspart (novoLOG) injection 0-15 Units  0-15 Units Subcutaneous TID WC Quintella Reichert, MD   5 Units at 02/17/13 0805  . lisinopril (PRINIVIL,ZESTRIL) tablet 20 mg  20 mg Oral Daily Quintella Reichert, MD   20 mg at 02/16/13 0934  . metoprolol tartrate (LOPRESSOR) tablet 25 mg  25 mg Oral BID Quintella Reichert, MD   25 mg at 02/16/13 2217  . nitroGLYCERIN (NITROSTAT) SL tablet 0.4 mg  0.4 mg Sublingual Q5 Min x 3 PRN Quintella Reichert, MD      . nitroGLYCERIN 0.2 mg/mL in dextrose 5 % infusion  2-200 mcg/min Intravenous Titrated [name], MD 6 mL/hr at  02/16/13 0100 20 mcg/min at 02/16/13 0100  . ondansetron (ZOFRAN) injection 4 mg  4 mg Intravenous Q6H PRN Quintella Reichert, MD        PE: General appearance: alert, cooperative and no distress Lungs: clear to auscultation bilaterally Heart: regular rate and rhythm Extremities: no LEE Pulses: 2+ and symmetric Skin: warm and dry Neurologic: Grossly normal  Lab Results:   Recent Labs  02/15/13 2314 02/16/13 0243  WBC 5.3 5.4  HGB 13.4 12.5*  HCT 37.5* 34.8*  PLT 280 261   BMET  Recent Labs  02/15/13 2314 02/16/13 0245  NA 133* 135  K 3.4* 3.6  CL 95* 100  CO2 28 28  GLUCOSE 236* 226*  BUN 16 18  CREATININE 1.24 0.99  CALCIUM 9.9 9.0   PT/INR  Recent Labs  02/16/13 0243  LABPROT 12.7  INR 0.97   Cholesterol  Recent Labs  02/16/13 0245  CHOL 185    Assessment/Plan  Principal Problem:   Chest pain Active Problems:   CAD (coronary artery disease)   Tobacco abuse   HTN (hypertension)   DM type 2 (diabetes mellitus, type 2)  Plan: Pt denies further chest discomfort. No exertional angina with cardiac rehab. He continues to be moderately hypertensive with SBP in the 160s. His  morning meds have not yet been administered. He is on 25 mg of Lopressor BID and 20 mg of Lisinopril daily. Will reassess BP later today. If still hypertensive, may need to add another antihypertensive (? HCTZ or amlodipine). If BP is stable, can likely discharge home today.     LOS: 2 days    Antoinne Spadaccini M. Sharol Harness, PA-C 02/17/2013 9:30 AM

## 2013-02-17 NOTE — Progress Notes (Signed)
CARDIAC REHAB PHASE I   PRE:  Rate/Rhythm: 84 SR    BP: sitting 150/70    SaO2:   MODE:  Ambulation: 550 ft   POST:  Rate/Rhythm: 98 SR    BP: sitting 170/80     SaO2:   Tolerated well, no c/o. Anxious to go home. Reviewed risk factor modification. Motivated to stay quit smoking. Not interested in CRPII, will go back to gym to ex. Encouraged better DM and HTN control. [id number]   Elissa Lovett [place] CES, ACSM 02/17/2013 9:31 AM

## 2013-02-17 NOTE — Progress Notes (Signed)
8/14  CBGs on 8/13  [phone] mg/dl   1/61  096-045 mg/dl  Recommend starting Lantus 20 units daily if CBGs continue greater than 180 mg/dl.  Patient does take Lantus 28 units daily at home along with Glyburide and Metformin.  Smith Mince RN BSN CDE

## 2013-03-03 ENCOUNTER — Ambulatory Visit: Payer: 59 | Admitting: Physician Assistant

## 2013-03-21 ENCOUNTER — Ambulatory Visit (INDEPENDENT_AMBULATORY_CARE_PROVIDER_SITE_OTHER): Payer: Medicare Other | Admitting: Physician Assistant

## 2013-03-21 ENCOUNTER — Encounter: Payer: Self-pay | Admitting: Physician Assistant

## 2013-03-21 VITALS — BP 140/80 | HR 72 | Ht 66.0 in | Wt 179.0 lb

## 2013-03-21 DIAGNOSIS — I10 Essential (primary) hypertension: Secondary | ICD-10-CM

## 2013-03-21 DIAGNOSIS — I2510 Atherosclerotic heart disease of native coronary artery without angina pectoris: Secondary | ICD-10-CM

## 2013-03-21 MED ORDER — LISINOPRIL 40 MG PO TABS: 40.0000 mg | ORAL_TABLET | Freq: Every day | ORAL | Status: DC

## 2013-03-21 MED ORDER — HYDROCHLOROTHIAZIDE 25 MG PO TABS: 25.0000 mg | ORAL_TABLET | Freq: Every day | ORAL | Status: DC

## 2013-03-21 NOTE — Assessment & Plan Note (Signed)
Blood pressure still not at target. Going to increase his lisinopril to 40 mg daily.

## 2013-03-21 NOTE — Progress Notes (Signed)
Date:  03/21/2013   ID:  Andras Grunewald, DOB 1946/03/21, MRN [id number]  PCP:  Ralene Ok, MD  Primary Cardiologist:  Allyson Sabal     History of Present Illness: Austin Mclaughlin is a 67 y.o. male with a history of CAD, s/p cath in 2008 after a stress test for CP showed ischemia. The cath, at that time, demonstrated an occluded PDA and collaterals with high grade OM2 disease and proximal segmental AV groove left circ disease. He underwent PCI of the AV groove left circ and OM2 with DES. He had reportedly done well until developing chest pain the day prior to arrival to the ED. The CP had been intermittent for over the course of 24 hours, before recurring and prompting the patient to seek evaluation. He described the pain as a sharp, stinging pain in his left chest with no SOB, nausea or diaphoresis. At the [facility] ER he was noted to have 4mm of ST elevation in V1 and V2 and a STEMI was called and he was transferred to [facility]. On arrival to [facility], his chest pain resolved. He was taken urgently to the cath lab. The procedure was performed by Dr. Allyson Sabal via the right femoral artery. It demonstrated a patent LAD stent and diffuse RCA disease. Medical therapy was recommended. He left the cath lab in stable condition. Post-cath, he was noted to have moderate hypertension. He was placed on Lopressor, HCTZ and lisinopril. He had mild improvement in BP. He had no further chest pain. The right femoral access site remained stable, free of hematoma and bruit  The patient presents now for follow up and BP evaluation.  The patient reports some chest pain very mild and occurs "once in a while" it lasts a second or 2.  He otherwise denies nausea, vomiting, fever, chest pain, shortness of breath, orthopnea, dizziness, PND, cough, congestion, abdominal pain, hematochezia, melena, lower extremity edema, claudication.  Wt Readings from Last 3 Encounters:  03/21/13 179 lb (81.194 kg)  02/16/13 183 lb 3.2 oz (83.1 kg)     Past Medical  History  Diagnosis Date  . Hypertension   . Coronary artery disease   . High cholesterol   . Myocardial infarction ~ 2007  . Type II diabetes mellitus   . GERD (gastroesophageal reflux disease)   . Arthritis     "all over" (02/17/2013)  . Chronic lower back pain     Current Outpatient Prescriptions  Medication Sig Dispense Refill  . aspirin EC 81 MG EC tablet Take 1 tablet (81 mg total) by mouth daily.      . clopidogrel (PLAVIX) 75 MG tablet Take 75 mg by mouth daily.      Marland Kitchen glyBURIDE-metformin (GLUCOVANCE) 5-500 MG per tablet Take 1 tablet by mouth daily with breakfast.      . insulin glargine (LANTUS) 100 UNIT/ML injection Inject 36 Units into the skin daily.       . metFORMIN (GLUCOPHAGE-XR) 500 MG 24 hr tablet Take 500 mg by mouth 2 (two) times daily. Pt. States he only taking once a day cause it makes him dizzy      . metoprolol tartrate (LOPRESSOR) 25 MG tablet Take 1 tablet (25 mg total) by mouth 2 (two) times daily.  60 tablet  5  . nisoldipine (SULAR) 40 MG 24 hr tablet Take 40 mg by mouth daily.      . nitroGLYCERIN (NITROSTAT) 0.4 MG SL tablet Place 1 tablet (0.4 mg total) under the tongue every 5 (five) minutes  x 3 doses as needed for chest pain.  25 tablet  2  . pantoprazole (PROTONIX) 40 MG tablet Take 40 mg by mouth daily.      . simvastatin (ZOCOR) 80 MG tablet Take 80 mg by mouth at bedtime.      . Vitamin D, Ergocalciferol, (DRISDOL) 50000 UNITS CAPS capsule Take 50,000 Units by mouth daily.      . hydrochlorothiazide (HYDRODIURIL) 25 MG tablet Take 1 tablet (25 mg total) by mouth daily.  90 tablet  3  . lisinopril (PRINIVIL,ZESTRIL) 40 MG tablet Take 1 tablet (40 mg total) by mouth daily.  90 tablet  3   No current facility-administered medications for this visit.    Allergies:   No Known Allergies  Social History:  The patient  reports that he quit smoking about 4 weeks ago. His smoking use included Cigarettes. He has a 39 pack-year smoking history. He has never  used smokeless tobacco. He reports that he drinks about 7.2 ounces of alcohol per week. He reports that he does not use illicit drugs.   Family history:  History reviewed. No pertinent family history.  ROS:  Please see the history of present illness.  All other systems reviewed and negative.   PHYSICAL EXAM: VS:  BP 140/80  Pulse 72  Ht 5\' 6"  (1.676 m)  Wt 179 lb (81.194 kg)  BMI 28.91 kg/m2 Well nourished, well developed, in no acute distress HEENT: Pupils are equal round react to light accommodation extraocular movements are intact.  Neck: no JVDNo cervical lymphadenopathy. Cardiac: Regular rate and rhythm without murmurs rubs or gallops. Lungs:  clear to auscultation bilaterally, no wheezing, rhonchi or rales Ext: no lower extremity edema.  2+ radial and 1+ dorsalis pedis pulses. Skin: warm and dry.  Right groin is nontender no hematoma or ecchymosis. Neuro:  Grossly normal  EKG:    Normal sinus rhythm with some nonspecific T-wave changes in V5 and 6.  Rate 72 beats per minute.  ASSESSMENT AND PLAN:  Problem List Items Addressed This Visit   HTN (hypertension)     Blood pressure still not at target. Going to increase his lisinopril to 40 mg daily.      Relevant Medications      lisinopril (PRINIVIL,ZESTRIL) tablet      hydrochlorothiazide tablet   CAD (coronary artery disease) - Primary     Patient complains of nonspecific mild chest pain which occurs once in a while and only for left second or 2.  No changes to current therapy.    Relevant Medications      lisinopril (PRINIVIL,ZESTRIL) tablet      hydrochlorothiazide tablet      Followup with Dr. Allyson Sabal in 6 months

## 2013-03-21 NOTE — Patient Instructions (Signed)
Stop the lisinopril/ HCTZ 20/25mg  combination and start lisinopril 40 and HCTZ 25mg .  Follow up with Dr. Allyson Sabal in six months.

## 2013-03-21 NOTE — Assessment & Plan Note (Signed)
Patient complains of nonspecific mild chest pain which occurs once in a while and only for left second or 2.  No changes to current therapy.

## 2013-03-23 ENCOUNTER — Encounter: Payer: Self-pay | Admitting: Cardiovascular Disease

## 2013-05-09 ENCOUNTER — Encounter: Payer: Self-pay | Admitting: Podiatry

## 2013-05-09 ENCOUNTER — Ambulatory Visit (INDEPENDENT_AMBULATORY_CARE_PROVIDER_SITE_OTHER): Payer: 59 | Admitting: Podiatry

## 2013-05-09 VITALS — BP 181/91 | HR 83 | Resp 20 | Ht 66.0 in | Wt 175.0 lb

## 2013-05-09 DIAGNOSIS — M79609 Pain in unspecified limb: Secondary | ICD-10-CM

## 2013-05-09 DIAGNOSIS — B351 Tinea unguium: Secondary | ICD-10-CM

## 2013-05-09 NOTE — Progress Notes (Signed)
  Subjective:    Patient ID: Austin Mclaughlin, male    DOB: 01-Apr-1946, 67 y.o.   MRN: [id number] "I guess they sent me over here to have my toenails looked at, I'm Diabetic."   HPI Comments: N  Diabetic, brittle, discolored L  Debridement b/l toenails D  10 years O Slowly C  About the same A  None T  Cut them myself      Review of Systems  Constitutional: Negative.   All other systems reviewed and are negative.       Objective:   Physical Exam Vascular: The DP pulses are  zero over four bilaterally. The DP is are two over four bilaterally. Capillary fill is immediate bilaterally.  Neurological: Sensation to 10 g monofilament wire intact 10 over 10 locations bilaterally. Vibratory sensation diminished bilaterally. Knee and and ankle reflexes are equal and reactive bilaterally.  Dermatological: He elongated hypertrophic toenails with palpable tenderness in all 10 toenails.  Musculoskeletal: No deformities noted. Patient has stable gait.        Assessment & Plan:  Assessment: Diminished DP pulses bilaterally suggest possible peripheral arterial disease. Mild sensory neuropathy bilaterally. Symptomatic onychomycosis x10  Plan: Nails x10 are debrided back without any bleeding. Return ovals recommended in 3 months for nail debridement.  Richard C.Leeanne Deed, DPM

## 2013-07-04 ENCOUNTER — Other Ambulatory Visit: Payer: Self-pay | Admitting: Internal Medicine

## 2013-07-04 DIAGNOSIS — F17200 Nicotine dependence, unspecified, uncomplicated: Secondary | ICD-10-CM

## 2013-07-04 DIAGNOSIS — R0989 Other specified symptoms and signs involving the circulatory and respiratory systems: Secondary | ICD-10-CM

## 2013-07-04 DIAGNOSIS — E119 Type 2 diabetes mellitus without complications: Secondary | ICD-10-CM

## 2013-07-15 ENCOUNTER — Other Ambulatory Visit: Payer: Medicare Other

## 2013-07-25 ENCOUNTER — Ambulatory Visit
Admission: RE | Admit: 2013-07-25 | Discharge: 2013-07-25 | Disposition: A | Discharge status: Home / Self Care | Payer: Medicare Other | Source: Ambulatory Visit | Attending: Internal Medicine | Admitting: Internal Medicine

## 2013-07-25 DIAGNOSIS — R0989 Other specified symptoms and signs involving the circulatory and respiratory systems: Secondary | ICD-10-CM

## 2013-07-25 DIAGNOSIS — E119 Type 2 diabetes mellitus without complications: Secondary | ICD-10-CM

## 2013-07-25 DIAGNOSIS — F17200 Nicotine dependence, unspecified, uncomplicated: Secondary | ICD-10-CM

## 2013-08-08 ENCOUNTER — Ambulatory Visit: Payer: 59 | Admitting: Podiatry

## 2013-08-23 ENCOUNTER — Other Ambulatory Visit (HOSPITAL_COMMUNITY): Payer: Self-pay | Admitting: Cardiology

## 2013-08-23 NOTE — Telephone Encounter (Signed)
Rx was sent to pharmacy electronically. 

## 2014-02-05 ENCOUNTER — Emergency Department (HOSPITAL_COMMUNITY)
Admission: EM | Admit: 2014-02-05 | Discharge: 2014-02-05 | Disposition: A | Discharge status: Home / Self Care | Payer: Medicare Other | Attending: Emergency Medicine | Admitting: Emergency Medicine

## 2014-02-05 ENCOUNTER — Encounter (HOSPITAL_COMMUNITY): Payer: Self-pay | Admitting: Emergency Medicine

## 2014-02-05 DIAGNOSIS — I10 Essential (primary) hypertension: Secondary | ICD-10-CM

## 2014-02-05 DIAGNOSIS — I252 Old myocardial infarction: Secondary | ICD-10-CM | POA: Diagnosis not present

## 2014-02-05 DIAGNOSIS — IMO0002 Reserved for concepts with insufficient information to code with codable children: Secondary | ICD-10-CM | POA: Insufficient documentation

## 2014-02-05 DIAGNOSIS — F17200 Nicotine dependence, unspecified, uncomplicated: Secondary | ICD-10-CM | POA: Diagnosis not present

## 2014-02-05 DIAGNOSIS — R42 Dizziness and giddiness: Secondary | ICD-10-CM | POA: Diagnosis not present

## 2014-02-05 DIAGNOSIS — Z7982 Long term (current) use of aspirin: Secondary | ICD-10-CM | POA: Insufficient documentation

## 2014-02-05 DIAGNOSIS — I2510 Atherosclerotic heart disease of native coronary artery without angina pectoris: Secondary | ICD-10-CM | POA: Insufficient documentation

## 2014-02-05 DIAGNOSIS — Z794 Long term (current) use of insulin: Secondary | ICD-10-CM | POA: Diagnosis not present

## 2014-02-05 DIAGNOSIS — M542 Cervicalgia: Secondary | ICD-10-CM | POA: Diagnosis not present

## 2014-02-05 DIAGNOSIS — E7800 Pure hypercholesterolemia, unspecified: Secondary | ICD-10-CM | POA: Diagnosis not present

## 2014-02-05 DIAGNOSIS — M129 Arthropathy, unspecified: Secondary | ICD-10-CM | POA: Diagnosis not present

## 2014-02-05 DIAGNOSIS — K219 Gastro-esophageal reflux disease without esophagitis: Secondary | ICD-10-CM | POA: Diagnosis not present

## 2014-02-05 DIAGNOSIS — G8929 Other chronic pain: Secondary | ICD-10-CM | POA: Diagnosis not present

## 2014-02-05 DIAGNOSIS — Z951 Presence of aortocoronary bypass graft: Secondary | ICD-10-CM | POA: Insufficient documentation

## 2014-02-05 DIAGNOSIS — Z79899 Other long term (current) drug therapy: Secondary | ICD-10-CM | POA: Insufficient documentation

## 2014-02-05 DIAGNOSIS — Z9861 Coronary angioplasty status: Secondary | ICD-10-CM | POA: Diagnosis not present

## 2014-02-05 DIAGNOSIS — E119 Type 2 diabetes mellitus without complications: Secondary | ICD-10-CM | POA: Diagnosis not present

## 2014-02-05 DIAGNOSIS — Z7902 Long term (current) use of antithrombotics/antiplatelets: Secondary | ICD-10-CM | POA: Diagnosis not present

## 2014-02-05 LAB — BASIC METABOLIC PANEL
Anion gap: 12 (ref 5–15)
BUN: 18 mg/dL (ref 6–23)
CALCIUM: 9.8 mg/dL (ref 8.4–10.5)
CO2: 28 mEq/L (ref 19–32)
Chloride: 98 mEq/L (ref 96–112)
Creatinine, Ser: 1.06 mg/dL (ref 0.50–1.35)
GFR calc Af Amer: 81 mL/min — ABNORMAL LOW (ref >90)
GFR, EST NON AFRICAN AMERICAN: 70 mL/min — AB (ref >90)
GLUCOSE: 243 mg/dL — AB (ref 70–99)
Potassium: 3.6 mEq/L — ABNORMAL LOW (ref 3.7–5.3)
SODIUM: 138 meq/L (ref 137–147)

## 2014-02-05 LAB — CBC
HCT: 36.6 % — ABNORMAL LOW (ref 39.0–52.0)
Hemoglobin: 12.9 g/dL — ABNORMAL LOW (ref 13.0–17.0)
MCH: 32.8 pg (ref 26.0–34.0)
MCHC: 35.2 g/dL (ref 30.0–36.0)
MCV: 93.1 fL (ref 78.0–100.0)
PLATELETS: 253 10*3/uL (ref 150–400)
RBC: 3.93 MIL/uL — ABNORMAL LOW (ref 4.22–5.81)
RDW: 12.9 % (ref 11.5–15.5)
WBC: 4.5 10*3/uL (ref 4.0–10.5)

## 2014-02-05 MED ORDER — CYCLOBENZAPRINE HCL 5 MG PO TABS: 5.0000 mg | ORAL_TABLET | Freq: Three times a day (TID) | ORAL | Status: DC | PRN

## 2014-02-05 MED ORDER — POTASSIUM CHLORIDE CRYS ER 20 MEQ PO TBCR
40.0000 meq | EXTENDED_RELEASE_TABLET | Freq: Once | ORAL | Status: AC
  Administered 2014-02-05: 40 meq via ORAL
  Filled 2014-02-05: qty 2

## 2014-02-05 NOTE — ED Provider Notes (Signed)
CSN: [id number]     Arrival date & time 02/05/14  1642 History   First MD Initiated Contact with Patient 02/05/14 1646   History provided by patient. Wife at bedside.   Chief Complaint  Patient presents with  . Headache  . Hypertension    HPI  Patient reports symptoms started about 2 weeks ago with Right-sided neck pain that started when woke up one day, denies any injury or accident. Described persistent aching pain without significant radiation, or associated symptoms. Denies weakness, numbness, tingling, vision changes, or frontal / radiating headaches. Reported additional symptoms started 1 week ago with intermittent brief "lightheaded spells", sudden onset, last less than 1-2 mins, resolve spontaneously, no known trigger as he was standing up sometimes, others he was sitting down and just stood up, denies exertional related. Denies any LOC or feeling like he may pass out, chest pain, chest tightness, SOB, nausea/vomiting, abdominal pain.  Current course with some inc dizziness/lightheaded episodes x 2 last night, and x 1 this AM, [facility] and was told that his BP was elevated, advised to go to ED for further evaluation. He admits to compliance with all meds including BP meds, did not miss any doses recently, took all meds today.  Significant PMH includes HTN (on multiple medications), known CAD (s/p MI with cath and stent placement in 2008), active smoker with long hx tobacco abuse (>40 yrs), DM2.    Past Medical History  Diagnosis Date  . Hypertension   . Coronary artery disease   . High cholesterol   . Myocardial infarction ~ 2007  . Type II diabetes mellitus   . GERD (gastroesophageal reflux disease)   . Arthritis     "all over" (02/17/2013)  . Chronic lower back pain    Past Surgical History  Procedure Laterality Date  . Coronary artery bypass graft    . Coronary angioplasty with stent placement  ~ 2007    "2 stents" (02/17/2013)  . Cardiac  catheterization  02/15/2013   No family history on file. History  Substance Use Topics  . Smoking status: Current Every Day Smoker -- 1.00 packs/day for 39 years    Types: Cigarettes    Last Attempt to Quit: 02/15/2013  . Smokeless tobacco: Never Used     Comment: every once in a while  . Alcohol Use: 8.4 oz/week    12 Cans of beer, 2 Shots of liquor per week     Comment: 02/17/2013 "6 beers/weekend day"    Review of Systems See above HPI  Allergies  Review of patient's allergies indicates no known allergies.  Home Medications   Prior to Admission medications   Medication Sig Start Date End Date Taking? Authorizing Provider  clopidogrel (PLAVIX) 75 MG tablet Take 75 mg by mouth daily.   Yes Historical Provider, MD  glimepiride (AMARYL) 2 MG tablet Take 2 mg by mouth daily.   Yes Historical Provider, MD  hydrochlorothiazide (HYDRODIURIL) 25 MG tablet Take 1 tablet (25 mg total) by mouth daily. 03/21/13  Yes Wilburt Finlay, PA-C  insulin glargine (LANTUS) 100 UNIT/ML injection Inject 36 Units into the skin daily.    Yes Historical Provider, MD  lisinopril (PRINIVIL,ZESTRIL) 40 MG tablet Take 1 tablet (40 mg total) by mouth daily. 03/21/13  Yes Wilburt Finlay, PA-C  metFORMIN (GLUCOPHAGE-XR) 500 MG 24 hr tablet Take 500 mg by mouth 2 (two) times daily. Pt. States he only taking once a day cause it makes him dizzy  Yes Historical Provider, MD  metoprolol tartrate (LOPRESSOR) 25 MG tablet Take 25 mg by mouth 2 (two) times daily.   Yes Historical Provider, MD  nitroGLYCERIN (NITROSTAT) 0.4 MG SL tablet Place 1 tablet (0.4 mg total) under the tongue every 5 (five) minutes x 3 doses as needed for chest pain. 02/17/13  Yes Brittainy Simmons, PA-C  pantoprazole (PROTONIX) 40 MG tablet Take 40 mg by mouth daily.   Yes Historical Provider, MD  simvastatin (ZOCOR) 80 MG tablet Take 80 mg by mouth at bedtime.   Yes Historical Provider, MD  aspirin EC 81 MG EC tablet Take 1 tablet (81 mg total) by mouth  daily. 02/17/13   Brittainy Simmons, PA-C  nisoldipine (SULAR) 40 MG 24 hr tablet Take 40 mg by mouth daily.    Historical Provider, MD  Vitamin D, Ergocalciferol, (DRISDOL) 50000 UNITS CAPS capsule Take 50,000 Units by mouth daily.    Historical Provider, MD   BP 171/70  Pulse 80  Temp(Src) 98.4 F (36.9 C)  Resp 20  SpO2 99% Physical Exam  Gen - well-appearing, cooperative, conversational, NAD HEENT - NCAT, PERRL, EOMI, oropharynx clear, MMM Neck - supple, non-tender to palpation, some evidence of inc R-trapezius hypertonicity with slightly reduced Right neck rotation Heart - RRR, no murmurs heard Lungs - CTAB, no wheezing, crackles, or rhonchi. Normal work of breathing. Abd - soft, NTND, no masses, +active BS Ext - non-tender, no edema, peripheral pulses intact +2 b/l Skin - warm, dry, no rashes Neuro - awake, alert, oriented, grossly non-focal, intact muscle strength 5/5 b/l, intact distal sensation to light touch  ED Course  Procedures (including critical care time) Labs Review Labs Reviewed  BASIC METABOLIC PANEL - Abnormal; Notable for the following:    Potassium 3.6 (*)    Glucose, Bld 243 (*)    GFR calc non Af Amer 70 (*)    GFR calc Af Amer 81 (*)    All other components within normal limits  CBC - Abnormal; Notable for the following:    RBC 3.93 (*)    Hemoglobin 12.9 (*)    HCT 36.6 (*)    All other components within normal limits    Imaging Review No results found.   EKG Interpretation   Date/Time:  Sunday February 05 2014 16:51:24 EDT Ventricular Rate:  88 PR Interval:  184 QRS Duration: 91 QT Interval:  381 QTC Calculation: 461 R Axis:   25 Text Interpretation:  Sinus rhythm Anteroseptal infarct, old nonspecific  st/t wave abnormalities artifact No significant change since last tracing  Confirmed by [facility]  MD, MARTHA [phone] on 02/05/2014 5:37:13 PM      MDM   Final diagnoses:  Essential hypertension  Neck pain on right side  Intermittent  lightheadedness   [age] yr M with PMH HTN, known CAD (s/p MI, cath/stent in 2008), tobacco abuse, DM2, who presents with 1-2 weeks of transient brief dizziness/lightheadedness spells and persistent R-neck pain. [facility], found to have BP 170s/80s on arrival, other vitals stable, no new symptoms. Denies any CP, SOB, HA, vision changes, focal neuro changes. EKG obtained without acute ST-T wave changes. Given history, suspect that occasional dizziness episodes and neck pain are unrelated to current elevated HTN episode, chart review shows prior elevated BP readings and suspected poorly controlled HTN at baseline.  Proceed with lab workup with BMET and CBC to eval for any evidence of unlikely end organ damage. No CP/SOB or abnormalities on  EKG to suggest MI or further cardiac work-up. Elevated BP does not warrant acute treatment at this time. Monitor in ED.  UPDATE @ 1759 - Labs notable for mild hyperkalemia 3.6, will replace with K-dur 40mg  PO x 1 dose, hyperglycemia 243, stable Cr 1.06. Hgb mildly low at 12.9 (consistent with prior Hgb b/l), nml WBC. Patient feels overall improved, reduced neck pain, no episodes of dizziness/lightheadedness, or new symptoms. BP improved to 160s/70s.  Discharge to home with reassurance, short course Flexeril rx for neck pain, advised to cont conservative therapy, monitor other symptoms, return precautions. Recommend close follow-up with PCP in 2-3 days and follow-up with Cardiologist as scheduled.    Saralyn Pilar, DO 02/05/14 Pat Gully

## 2014-02-05 NOTE — Discharge Instructions (Signed)
You were sent to the Emergency Department due to Elevated Blood Pressure. Your BP here was up to 170s/80s. Our EKG test showed no abnormalities. Blood work showed normal blood count. We do not have a good explanation for your dizziness/lightheadedness episodes, but testing shows that it does not appear to be related to your heart. It seems like your neck pain may be a muscle strain, and we have prescribed a trial of Flexeril (muscle relaxant) please take this as needed up to 3 times a day, caution it may make you sleepy, do not drive until you see how it makes you feel. Also recommend, continue to use heating pad and recommend regular Tylenol / Ibuprofen use to see if it improves. Your potassium was slightly low, and we gave you 1 potassium tablet. Recommend re-check when you see your regular doctor.  Recommend close follow-up with your regular doctor to check your BP in clinic and adjust your BP meds if needed. Also, would recommend that you see your Heart Doctor within the next few weeks to 1 month.  If you develop worsening symptoms, persistent dizziness/lightheadedness, or sensation that you are going to pass out, chest pain / tightness, shortness of breath, or worsening / extension of headache, then we recommend seeking immediate medical attention, or return to the Emergency Department.   Hypertension Hypertension, commonly called high blood pressure, is when the force of blood pumping through your arteries is too strong. Your arteries are the blood vessels that carry blood from your heart throughout your body. A blood pressure reading consists of a higher number over a lower number, such as 110/72. The higher number (systolic) is the pressure inside your arteries when your heart pumps. The lower number (diastolic) is the pressure inside your arteries when your heart relaxes. Ideally you want your blood pressure below 120/80. Hypertension forces your heart to work harder to pump blood. Your  arteries may become narrow or stiff. Having hypertension puts you at risk for heart disease, stroke, and other problems.  RISK FACTORS Some risk factors for high blood pressure are controllable. Others are not.  Risk factors you cannot control include:   Race. You may be at higher risk if you are African American.  Age. Risk increases with age.  Gender. Men are at higher risk than women before age 70 years. After age 70, women are at higher risk than men. Risk factors you can control include:  Not getting enough exercise or physical activity.  Being overweight.  Getting too much fat, sugar, calories, or salt in your diet.  Drinking too much alcohol. SIGNS AND SYMPTOMS Hypertension does not usually cause signs or symptoms. Extremely high blood pressure (hypertensive crisis) may cause headache, anxiety, shortness of breath, and nosebleed. DIAGNOSIS  To check if you have hypertension, your health care provider will measure your blood pressure while you are seated, with your arm held at the level of your heart. It should be measured at least twice using the same arm. Certain conditions can cause a difference in blood pressure between your right and left arms. A blood pressure reading that is higher than normal on one occasion does not mean that you need treatment. If one blood pressure reading is high, ask your health care provider about having it checked again. TREATMENT  Treating high blood pressure includes making lifestyle changes and possibly taking medicine. Living a healthy lifestyle can help lower high blood pressure. You may need to change some of your habits. Lifestyle changes may include:  Following the DASH diet. This diet is high in fruits, vegetables, and whole grains. It is low in salt, red meat, and added sugars.  Getting at least 2 hours of brisk physical activity every week.  Losing weight if necessary.  Not smoking.  Limiting alcoholic beverages.  Learning ways to  reduce stress. If lifestyle changes are not enough to get your blood pressure under control, your health care provider may prescribe medicine. You may need to take more than one. Work closely with your health care provider to understand the risks and benefits. HOME CARE INSTRUCTIONS  Have your blood pressure rechecked as directed by your health care provider.   Take medicines only as directed by your health care provider. Follow the directions carefully. Blood pressure medicines must be taken as prescribed. The medicine does not work as well when you skip doses. Skipping doses also puts you at risk for problems.   Do not smoke.   Monitor your blood pressure at home as directed by your health care provider. SEEK MEDICAL CARE IF:   You think you are having a reaction to medicines taken.  You have recurrent headaches or feel dizzy.  You have swelling in your ankles.  You have trouble with your vision. SEEK IMMEDIATE MEDICAL CARE IF:  You develop a severe headache or confusion.  You have unusual weakness, numbness, or feel faint.  You have severe chest or abdominal pain.  You vomit repeatedly.  You have trouble breathing. MAKE SURE YOU:   Understand these instructions.  Will watch your condition.  Will get help right away if you are not doing well or get worse. Document Released: 06/23/2005 Document Revised: 11/07/2013 Document Reviewed: 04/15/2013 [facility] Patient Information 2015 [place]. This information is not intended to replace advice given to you by your health care provider. Make sure you discuss any questions you have with your health care provider.

## 2014-02-05 NOTE — ED Notes (Signed)
Pt A+Ox4, presents with c/o R sided neck/posterior ear pain x2 weeks "i thought it was the pillows i was sleeping on".  4/10. Pt reports two episodes dizziness once last night, once this AM, while sitting, self resolved "after a few seconds". [facility] and BP was high and was told to come to ER.  Pt denies CP/SOB, speaking full/clear sentences, rr even/un-lab.  Neuros grossly intact, MAEI, +csm/+pulses, no facial asymmetry, tongue midline, speech clear, =hand grasps.  Skin PWD.  Placed to cardiac/02 monitor, changed to hospital gown.  NAD.

## 2014-02-05 NOTE — ED Provider Notes (Signed)
I saw and evaluated the patient, reviewed the resident's note and I agree with the findings and plan.   EKG Interpretation   Date/Time:  Sunday February 05 2014 16:51:24 EDT Ventricular Rate:  88 PR Interval:  184 QRS Duration: 91 QT Interval:  381 QTC Calculation: 461 R Axis:   25 Text Interpretation:  Sinus rhythm Anteroseptal infarct, old nonspecific  st/t wave abnormalities artifact No significant change since last tracing  Confirmed by [facility]  MD, Gabriella Woodhead [phone] on 02/05/2014 5:37:13 PM     ROS reviewed and all otherwise negative except for mentioned in HPI  Pt seen and evaluated, awake, alert, NAD, no increased respiratory effort, heart regular rhythm  Pt with c/o intermittent dizziness as well as right sided neck pain.  Labs reveal mild hypokalemia which was repleted in the ED.  No chest pain or difficulty breathing.  No palpable tenderness of neck on exam, will try flexeril for likely muscular pain.   Discharged with strict return precautions.  Pt agreeable with plan.  Ethelda ChickMartha K Linker, MD 02/05/14 Mikle [id number]

## 2014-02-09 ENCOUNTER — Other Ambulatory Visit (HOSPITAL_COMMUNITY): Payer: Self-pay | Admitting: Cardiovascular Disease

## 2014-02-09 NOTE — Telephone Encounter (Signed)
Rx was sent to pharmacy electronically. 

## 2014-02-24 ENCOUNTER — Ambulatory Visit (INDEPENDENT_AMBULATORY_CARE_PROVIDER_SITE_OTHER): Payer: Medicare Other | Admitting: Family Medicine

## 2014-02-24 ENCOUNTER — Ambulatory Visit: Payer: Medicare Other

## 2014-02-24 ENCOUNTER — Ambulatory Visit (INDEPENDENT_AMBULATORY_CARE_PROVIDER_SITE_OTHER): Payer: Medicare Other

## 2014-02-24 VITALS — BP 156/78 | HR 76 | Temp 97.9°F | Resp 16 | Ht 68.25 in | Wt 180.4 lb

## 2014-02-24 DIAGNOSIS — M542 Cervicalgia: Secondary | ICD-10-CM

## 2014-02-24 DIAGNOSIS — M436 Torticollis: Secondary | ICD-10-CM

## 2014-02-24 MED ORDER — PREDNISONE 20 MG PO TABS: 40.0000 mg | ORAL_TABLET | Freq: Every day | ORAL | Status: DC

## 2014-02-24 NOTE — Progress Notes (Signed)
Is a 68 year old gentleman who is retired. He spontaneously developed right posterior lower cervical spine pain about one week ago. He had no associated injury. He's had none fever, changes in strength in the right arm, numbness in the right arm.  Patient also denies cough or shortness of breath.  Patient says that he's gotten a little bit better over the last day or so. He is taking hydrocodone as prescribed by Dr. Golden PopMorreiro.  Patient states that his diabetes been under control.  Objective: Patient has slow but complete range of motion of his neck. He is nontender there and shows no swelling, adenopathy, or thyromegaly  Chest is clear without rales rhonchi Heart: Regular no murmur Right arm: Full range of motion, good radial pulse Skin: No rash  [facility] reading (PRIMARY) by  Dr. Milus GlazierLauenstein:  Cspine:  negative.  Assessment: Acute torticollis which is persisting although there has been some improvement in last 24 hours  Cervical spine pain - Plan: DG Cervical Spine 2 or 3 views, predniSONE (DELTASONE) 20 MG tablet, CANCELED: DG Cervical Spine Complete  Torticollis, acute - Plan: predniSONE (DELTASONE) 20 MG tablet   Signed, Elvina SidleKurt Linnet Bottari, MD

## 2014-02-24 NOTE — Patient Instructions (Signed)

## 2014-03-19 ENCOUNTER — Other Ambulatory Visit: Payer: Self-pay | Admitting: Physician Assistant

## 2014-03-20 NOTE — Telephone Encounter (Signed)
Rx was sent to pharmacy electronically. 

## 2014-04-06 ENCOUNTER — Other Ambulatory Visit: Payer: Self-pay | Admitting: Physician Assistant

## 2014-04-06 NOTE — Telephone Encounter (Signed)
Rx was sent to pharmacy electronically. 

## 2014-04-15 ENCOUNTER — Other Ambulatory Visit (HOSPITAL_COMMUNITY): Payer: Self-pay | Admitting: Cardiovascular Disease

## 2014-04-17 NOTE — Telephone Encounter (Signed)
Rx was sent to pharmacy electronically. OV 04/26/14

## 2014-04-26 ENCOUNTER — Encounter: Payer: Self-pay | Admitting: Cardiovascular Disease

## 2014-04-26 ENCOUNTER — Ambulatory Visit (INDEPENDENT_AMBULATORY_CARE_PROVIDER_SITE_OTHER): Payer: Medicare Other | Admitting: Cardiovascular Disease

## 2014-04-26 VITALS — BP 182/78 | HR 65 | Ht 67.0 in | Wt 177.0 lb

## 2014-04-26 DIAGNOSIS — Z79899 Other long term (current) drug therapy: Secondary | ICD-10-CM

## 2014-04-26 DIAGNOSIS — E785 Hyperlipidemia, unspecified: Secondary | ICD-10-CM | POA: Insufficient documentation

## 2014-04-26 DIAGNOSIS — I10 Essential (primary) hypertension: Secondary | ICD-10-CM

## 2014-04-26 DIAGNOSIS — I2510 Atherosclerotic heart disease of native coronary artery without angina pectoris: Secondary | ICD-10-CM

## 2014-04-26 MED ORDER — LOSARTAN POTASSIUM 50 MG PO TABS: 50.0000 mg | ORAL_TABLET | Freq: Every day | ORAL | Status: DC

## 2014-04-26 NOTE — Progress Notes (Signed)
04/26/2014 Austin SpitzAvon Early   02-07-1946  [id number]  Primary Physician Ralene OkMOREIRA,ROY, MD Primary Cardiologist: Runell GessJonathan J. Ashtin Rosner MD [place]FACP,FACC,FAHA, [place]   HPI:  Austin Mclaughlin is a 68y.o. Married [place] American male, father of 4,with a history of CAD, s/p cath in 2008 after a stress test for CP showed ischemia. The cath, at that time, demonstrated an occluded PDA and collaterals with high grade OM2 disease and proximal segmental AV groove left circ disease. He underwent PCI of the AV groove left circ and OM2 with DES. He had reportedly done well until developing chest pain the day prior to arrival to the ED. The CP had been intermittent for over the course of 24 hours, before recurring and prompting the patient to seek evaluation. He described the pain as a sharp, stinging pain in his left chest with no SOB, nausea or diaphoresis. [facility] he was noted to have 4mm of ST elevation in V1 and V2 and a STEMI was called and [facility] his chest pain resolved. He was taken urgently to the cath lab. The procedure was performed by Dr. Allyson SabalBerry via the right femoral artery. It demonstrated a patent LAD stent and diffuse RCA disease. Medical therapy was recommended. He left the cath lab in stable condition. Post-cath, he was noted to have moderate hypertension. He was placed on Lopressor, HCTZ and lisinopril. He had mild improvement in BP. The patient saw Hue[facility] BienenstockBrian [facility]ager [facility] office the Monday after discharge and his lisinopril was up titrated. Since that time he's had no chest pain and for some reason no longer is on an ACE inhibitor. He also has stopped smoking 6 weeks ago after smoking one pack per day for 55 years.    Current Outpatient Prescriptions  Medication Sig Dispense Refill  . aspirin EC 81 MG EC tablet Take 1 tablet (81 mg total) by mouth daily.      . clopidogrel (PLAVIX) 75 MG tablet Take 75 mg by mouth daily.      Marland Kitchen. glimepiride (AMARYL) 2 MG  tablet Take 2 mg by mouth daily.      . hydrochlorothiazide (HYDRODIURIL) 25 MG tablet Take 1 tablet (25 mg total) by mouth daily. PT MUST KEEP APPOINTMENT 04/26/2014 FOR FUTURE REFILLS.  30 tablet  1  . insulin glargine (LANTUS) 100 UNIT/ML injection Inject 36 Units into the skin daily.       . metFORMIN (GLUCOPHAGE-XR) 500 MG 24 hr tablet Take 500 mg by mouth 2 (two) times daily. Pt. States he only taking once a day cause it makes him dizzy      . metoprolol tartrate (LOPRESSOR) 25 MG tablet Take 1 tablet (25 mg total) by mouth 2 (two) times daily. Appointment 04/26/14  60 tablet  0  . nitroGLYCERIN (NITROSTAT) 0.4 MG SL tablet Place 1 tablet (0.4 mg total) under the tongue every 5 (five) minutes x 3 doses as needed for chest pain.  25 tablet  2  . pantoprazole (PROTONIX) 40 MG tablet Take 40 mg by mouth daily.      . simvastatin (ZOCOR) 80 MG tablet Take 80 mg by mouth at bedtime.      Marland Kitchen. losartan (COZAAR) 50 MG tablet Take 1 tablet (50 mg total) by mouth daily.  30 tablet  11   No current facility-administered medications for this visit.    No Known Allergies  History   Social History  . Marital Status:  Married    Spouse Name: [name]    Number of Children: [name]  . Years of Education: [name]   Occupational History  . Not on file.   Social History Main Topics  . Smoking status: Current Every Day Smoker -- 1.00 packs/day for 39 years    Types: Cigarettes    Last Attempt to Quit: 02/15/2013  . Smokeless tobacco: Never Used     Comment: every once in a while  . Alcohol Use: 8.4 oz/week    12 Cans of beer, 2 Shots of liquor per week     Comment: 02/17/2013 "6 beers/weekend day"  . Drug Use: No  . Sexual Activity: Yes   Other Topics Concern  . Not on file   Social History Narrative  . No narrative on file     Review of Systems: General: negative for chills, fever, night sweats or weight changes.  Cardiovascular: negative for chest pain, dyspnea on exertion, edema, orthopnea,  palpitations, paroxysmal nocturnal dyspnea or shortness of breath Dermatological: negative for rash Respiratory: negative for cough or wheezing Urologic: negative for hematuria Abdominal: negative for nausea, vomiting, diarrhea, bright red blood per rectum, melena, or hematemesis Neurologic: negative for visual changes, syncope, or dizziness All other systems reviewed and are otherwise negative except as noted above.    Blood pressure 182/78, pulse 65, height 5\' 7"  (1.702 m), weight 177 lb (80.287 kg).  General appearance: alert and no distress Neck: no adenopathy, no carotid bruit, no JVD, supple, symmetrical, trachea midline and thyroid not enlarged, symmetric, no tenderness/mass/nodules Lungs: clear to auscultation bilaterally Heart: regular rate and rhythm, S1, S2 normal, no murmur, click, rub or gallop Extremities: extremities normal, atraumatic, no cyanosis or edema  EKG normal sinus rhythm at 65 without ST or T wave changes  ASSESSMENT AND PLAN:   CAD (coronary artery disease) History of CAD status post circumflex stenting by myself in the proximal AV groove as well as OM 2 back in 2008. He was admitted in September last year which has been ruled out for myocardial infarction. Repeat cardiac catheterization performed on 02/16/13 revealed patent stents with a 60% proximal segmental OM1 branch stenosis and high-grade distal nondominant RCA disease normal LV function. Medical therapy was recommended. He has had no recurrent chest pain. He remains on dual antiplatelet therapy.  Hyperlipidemia On statin therapy followed by his PCP  HTN (hypertension) Poorly controlled today at home. He was on ACE inhibitor in the past (lisinopril 40 mg) which is no longer on. His blood pressure today in the office is 182/78. He has normal renal function. I am going to begin him on losartan 50 mg a day and we'll check a basic metabolic panel in 2-3 weeks after which he'll followup with Baxter HireKristen , our Pharm  D , for F/U of labs ands  medications and blood pressure check.      Runell GessJonathan J. Alric Geise MD FACP,FACC,FAHA, [facility]SCAI 04/26/2014 10:20 AM

## 2014-04-26 NOTE — Assessment & Plan Note (Signed)
On statin therapy followed by his PCP 

## 2014-04-26 NOTE — Assessment & Plan Note (Signed)
History of CAD status post circumflex stenting by myself in the proximal AV groove as well as OM 2 back in 2008. He was admitted in September last year which has been ruled out for myocardial infarction. Repeat cardiac catheterization performed on 02/16/13 revealed patent stents with a 60% proximal segmental OM1 branch stenosis and high-grade distal nondominant RCA disease normal LV function. Medical therapy was recommended. He has had no recurrent chest pain. He remains on dual antiplatelet therapy.

## 2014-04-26 NOTE — Assessment & Plan Note (Signed)
Poorly controlled today at home. He was on ACE inhibitor in the past (lisinopril 40 mg) which is no longer on. His blood pressure today in the office is 182/78. He has normal renal function. I am going to begin him on losartan 50 mg a day and we'll check a basic metabolic panel in 2-3 weeks after which he'll followup with Baxter HireKristen , our Pharm D , for F/U of labs ands  medications and blood pressure check.

## 2014-04-26 NOTE — Patient Instructions (Signed)
  We will see you back in follow up in 1 month with Belenda CruiseKristin for blood pressure; 6 months with Wilburt FinlayBryan Hager PA; 1 year with Dr Allyson SabalBerry.  Dr Allyson SabalBerry has ordered: 1. Start Losartan 50mg  daily (the prescription has been sent to the pharmacy for you)  2. Have blood work done in 2 weeks (1st floor, [address])

## 2014-05-16 ENCOUNTER — Other Ambulatory Visit: Payer: Self-pay | Admitting: Cardiovascular Disease

## 2014-05-16 NOTE — Telephone Encounter (Signed)
Rx was sent to pharmacy electronically. 

## 2014-05-29 ENCOUNTER — Ambulatory Visit (INDEPENDENT_AMBULATORY_CARE_PROVIDER_SITE_OTHER): Payer: Medicare Other | Admitting: Pharmacist Clinician (PhC)/ Clinical Pharmacy Specialist

## 2014-05-29 VITALS — BP 190/76 | HR 72 | Ht 67.0 in | Wt 182.0 lb

## 2014-05-29 DIAGNOSIS — I10 Essential (primary) hypertension: Secondary | ICD-10-CM

## 2014-05-29 MED ORDER — AMLODIPINE BESYLATE 5 MG PO TABS: 5.0000 mg | ORAL_TABLET | Freq: Every day | ORAL | Status: DC

## 2014-05-29 MED ORDER — ATORVASTATIN CALCIUM 40 MG PO TABS: 40.0000 mg | ORAL_TABLET | Freq: Every day | ORAL | Status: DC

## 2014-05-29 NOTE — Patient Instructions (Signed)
Return for a a follow up appointment in 3-4 weeks   Your blood pressure today is 190/76  (goal <140/90)  Take your BP meds as follows: finish off bottle of simvastatin 80mg , then switch to atorvastatin 40mg  for your cholesterol.  Add amlodipine 5mg  each evening for your blood pressure  Bring all of your meds, your BP cuff and your record of home blood pressures to your next appointment.  Exercise as you're able, try to walk approximately 30 minutes per day.  Keep salt intake to a minimum, especially watch canned and prepared boxed foods.  Eat more fresh fruits and vegetables and fewer canned items.  Avoid eating in fast food restaurants.    HOW TO TAKE YOUR BLOOD PRESSURE: . Rest 5 minutes before taking your blood pressure. .  Don't smoke or drink caffeinated beverages for at least 30 minutes before. . Take your blood pressure before (not after) you eat. . Sit comfortably with your back supported and both feet on the floor (don't cross your legs). . Elevate your arm to heart level on a table or a desk. . Use the proper sized cuff. It should fit smoothly and snugly around your bare upper arm. There should be enough room to slip a fingertip under the cuff. The bottom edge of the cuff should be 1 inch above the crease of the elbow. . Ideally, take 3 measurements at one sitting and record the average.

## 2014-05-30 ENCOUNTER — Encounter: Payer: Self-pay | Admitting: Pharmacist Clinician (PhC)/ Clinical Pharmacy Specialist

## 2014-05-30 NOTE — Progress Notes (Signed)
Plan sounds good.   Will forward to DR. Allyson SabalBerry.   Marykay LexHARDING,DAVID W, M.D., M.S. Interventional Cardiologist   Pager # [phone]

## 2014-05-30 NOTE — Progress Notes (Signed)
     05/30/2014 Austin Spitzvon Dickard [birth year] [id number]   HPI:  Austin Mclaughlin is a 68 y.o. male patient of Dr Allyson SabalBerry, with a PMH below who presents today for hypertension clinic evaluation.    Cardiac Hx: CAD post circumflex stenting 2008; STEMI with repeat cath 2014, pt on dual antiplatelet therapy.    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   Current Outpatient Prescriptions  Medication Sig Dispense Refill  . amLODipine (NORVASC) 5 MG tablet Take 1 tablet (5 mg total) by mouth daily. 30 tablet 3  . aspirin EC 81 MG EC tablet Take 1 tablet (81 mg total) by mouth daily.    Marland Kitchen. atorvastatin (LIPITOR) 40 MG tablet Take 1 tablet (40 mg total) by mouth daily. 30 tablet 3  . clopidogrel (PLAVIX) 75 MG tablet Take 75 mg by mouth daily.    Marland Kitchen. glimepiride (AMARYL) 2 MG tablet Take 2 mg by mouth daily.    . hydrochlorothiazide (HYDRODIURIL) 25 MG tablet Take 1 tablet (25 mg total) by mouth daily. PT MUST KEEP APPOINTMENT 04/26/2014 FOR FUTURE REFILLS. 30 tablet 1  . insulin glargine (LANTUS) 100 UNIT/ML injection Inject 36 Units into the skin daily.     Marland Kitchen. losartan (COZAAR) 50 MG tablet Take 1 tablet (50 mg total) by mouth daily. 30 tablet 11  . metFORMIN (GLUCOPHAGE-XR) 500 MG 24 hr tablet Take 500 mg by mouth 2 (two) times daily. Pt. States he only taking once a day cause it makes him dizzy    . metoprolol tartrate (LOPRESSOR) 25 MG tablet Take 1 tablet (25 mg total) by mouth 2 (two) times daily. 60 tablet 11  . nitroGLYCERIN (NITROSTAT) 0.4 MG SL tablet Place 1 tablet (0.4 mg total) under  the tongue every 5 (five) minutes x 3 doses as needed for chest pain. 25 tablet 2  . pantoprazole (PROTONIX) 40 MG tablet Take 40 mg by mouth daily.     No current facility-administered medications for this visit.    No Known Allergies  Past Medical History  Diagnosis Date  . Hypertension   . Coronary artery disease   . High cholesterol   . Myocardial infarction ~ 2007  . Type II diabetes mellitus   . GERD (gastroesophageal reflux disease)   . Arthritis     "all over" (02/17/2013)  . Chronic lower back pain     Blood pressure 190/76, pulse 72, height 5\' 7"  (1.702 m), weight 182 lb (82.555 kg). Standing 180/84   Phillips HayKristin Fergie Sherbert PharmD CPP [facility]

## 2014-05-30 NOTE — Assessment & Plan Note (Addendum)
Today his blood pressure is essentially unchanged.  He states compliance with medications.  Today we will add amlodipine 5 mg once daily in the evenings.   Of note he currently takes simvastatin 80 mg each evening.  Amlodipine and high dose simvastatin are contraindicated due to risk of rhabdomyalgia, so I will switch his simvastatin to atorvastatin 40 mg daily.  I explained the interaction and need for change to the patient.  Also reminded patient to get labs drawn that were ordered by Dr. Allyson SabalBerry at his last visit.  I will see him again in 1 month to see if there is improvement in BP.  He was encouraged to decrease his salt intake.

## 2014-06-28 ENCOUNTER — Ambulatory Visit: Payer: Medicare Other | Admitting: Pharmacist Clinician (PhC)/ Clinical Pharmacy Specialist

## 2014-07-06 ENCOUNTER — Ambulatory Visit (INDEPENDENT_AMBULATORY_CARE_PROVIDER_SITE_OTHER): Payer: Medicare Other | Admitting: Family Medicine

## 2014-07-06 VITALS — BP 176/88 | HR 79 | Temp 97.6°F | Resp 18 | Ht 67.25 in | Wt 179.0 lb

## 2014-07-06 DIAGNOSIS — I2510 Atherosclerotic heart disease of native coronary artery without angina pectoris: Secondary | ICD-10-CM

## 2014-07-06 DIAGNOSIS — R42 Dizziness and giddiness: Secondary | ICD-10-CM

## 2014-07-06 DIAGNOSIS — I10 Essential (primary) hypertension: Secondary | ICD-10-CM

## 2014-07-06 DIAGNOSIS — E119 Type 2 diabetes mellitus without complications: Secondary | ICD-10-CM

## 2014-07-06 LAB — POCT CBC
Granulocyte percent: 55.1 %G (ref 37–80)
HCT, POC: 43.2 % — AB (ref 43.5–53.7)
Hemoglobin: 14.1 g/dL (ref 14.1–18.1)
LYMPH, POC: 2.2 (ref 0.6–3.4)
MCH, POC: 30.3 pg (ref 27–31.2)
MCHC: 32.7 g/dL (ref 31.8–35.4)
MCV: 92.9 fL (ref 80–97)
MID (cbc): 0.4 (ref 0–0.9)
MPV: 7 fL (ref 0–99.8)
POC Granulocyte: 3.1 (ref 2–6.9)
POC LYMPH %: 38.6 % (ref 10–50)
POC MID %: 6.3 % (ref 0–12)
Platelet Count, POC: 263 10*3/uL (ref 142–424)
RBC: 4.65 M/uL — AB (ref 4.69–6.13)
RDW, POC: 13.5 %
WBC: 5.7 10*3/uL (ref 4.6–10.2)

## 2014-07-06 LAB — COMPREHENSIVE METABOLIC PANEL
ALT: 19 U/L (ref 0–53)
AST: 20 U/L (ref 0–37)
Albumin: 4.2 g/dL (ref 3.5–5.2)
Alkaline Phosphatase: 51 U/L (ref 39–117)
BILIRUBIN TOTAL: 0.4 mg/dL (ref 0.2–1.2)
BUN: 13 mg/dL (ref 6–23)
CALCIUM: 10 mg/dL (ref 8.4–10.5)
CO2: 29 meq/L (ref 19–32)
Chloride: 101 mEq/L (ref 96–112)
Creat: 0.99 mg/dL (ref 0.50–1.35)
GLUCOSE: 207 mg/dL — AB (ref 70–99)
POTASSIUM: 3.5 meq/L (ref 3.5–5.3)
Sodium: 140 mEq/L (ref 135–145)
TOTAL PROTEIN: 7.4 g/dL (ref 6.0–8.3)

## 2014-07-06 LAB — POCT URINALYSIS DIPSTICK
Bilirubin, UA: NEGATIVE
Ketones, UA: NEGATIVE
Leukocytes, UA: NEGATIVE
NITRITE UA: NEGATIVE
PH UA: 6.5
PROTEIN UA: NEGATIVE
SPEC GRAV UA: 1.01
UROBILINOGEN UA: 1

## 2014-07-06 LAB — GLUCOSE, POCT (MANUAL RESULT ENTRY): POC Glucose: 212 mg/dl — AB (ref 70–99)

## 2014-07-06 NOTE — Progress Notes (Addendum)
Subjective:  This chart was scribed for Austin Chick, MD by Austin Mclaughlin, ED Scribe. The patient was seen in room 9. Patient's care was started at 3:40 PM.   Patient ID: Austin Mclaughlin, male    DOB: 10-02-1945, 68 y.o.   MRN: [id number]  07/06/2014  Dizziness  HPI HPI Comments: Austin Mclaughlin is a 68 y.o. male, with a h/o HTN, MI, CAD, high cholesterol, DM, who presents to the [facility] complaining of intermittent dizziness over the past month, worsening since yesterday. Dizziness is exacerbated with standing, walking, sitting, turning his head, bending for extended periods of time. Pt reports x3-5 daily episodes that lasts for a few minutes. Pt suspected that dizziness was attributed to medications so he eliminated medications but dizziness persisted. He states that his BP is elevated today since he stopped taking the BP medication. Pt denies vision disturbances, HA, hearing loss, tinnitus, slurred speech, trouble swallowing, vomiting, chest pain, palpitations, SOB, diaphoresis, numbness/tingling, weakness, syncope.   DM Pt was seen by PCP, Austin Ok, MD, yesterday with blood glucose of 74. Pt was advised to take .5 Glipizide instead of 1 whole tablet. Last hgb A1C was 6.7.  Review of Systems  Constitutional: Negative for fever, chills, diaphoresis, activity change, appetite change and fatigue.  HENT: Negative for congestion, ear pain, hearing loss, postnasal drip, rhinorrhea, sinus pressure, sore throat, tinnitus and trouble swallowing.   Eyes: Negative for photophobia and visual disturbance.  Respiratory: Negative for cough and shortness of breath.   Cardiovascular: Negative for chest pain, palpitations and leg swelling.  Gastrointestinal: Negative for nausea, vomiting, abdominal pain and diarrhea.  Endocrine: Negative for cold intolerance, heat intolerance, polydipsia, polyphagia and polyuria.  Skin: Negative for color change, rash and wound.  Neurological: Positive  for dizziness. Negative for tremors, seizures, syncope, facial asymmetry, speech difficulty, weakness, light-headedness, numbness and headaches.  Psychiatric/Behavioral: Negative for sleep disturbance and dysphoric mood. The patient is not nervous/anxious.    Past Medical History  Diagnosis Date  . Hypertension   . Coronary artery disease   . High cholesterol   . Myocardial infarction ~ 2007  . Type II diabetes mellitus   . GERD (gastroesophageal reflux disease)   . Arthritis     "all over" (02/17/2013)  . Chronic lower back pain    Past Surgical History  Procedure Laterality Date  . Coronary artery bypass graft    . Coronary angioplasty with stent placement  ~ 2007    "2 stents" (02/17/2013)  . Cardiac catheterization  02/15/2013   No Known Allergies Current Outpatient Prescriptions  Medication Sig Dispense Refill  . aspirin EC 81 MG EC tablet Take 1 tablet (81 mg total) by mouth daily.    Marland Kitchen atorvastatin (LIPITOR) 40 MG tablet Take 1 tablet (40 mg total) by mouth daily. 30 tablet 3  . clopidogrel (PLAVIX) 75 MG tablet Take 75 mg by mouth daily.    Marland Kitchen glimepiride (AMARYL) 2 MG tablet Take 2 mg by mouth daily.    . hydrochlorothiazide (HYDRODIURIL) 25 MG tablet Take 1 tablet (25 mg total) by mouth daily. PT MUST KEEP APPOINTMENT 04/26/2014 FOR FUTURE REFILLS. 30 tablet 1  . insulin glargine (LANTUS) 100 UNIT/ML injection Inject 36 Units into the skin daily.     . metFORMIN (GLUCOPHAGE-XR) 500 MG 24 hr tablet Take 500 mg by mouth 2 (two) times daily. Pt. States he only taking once a day cause it makes him dizzy    . metoprolol tartrate (LOPRESSOR) 25  MG tablet Take 1 tablet (25 mg total) by mouth 2 (two) times daily. 60 tablet 11  . pantoprazole (PROTONIX) 40 MG tablet Take 40 mg by mouth daily.    Marland Kitchen. amLODipine (NORVASC) 5 MG tablet Take 1 tablet (5 mg total) by mouth daily. (Patient not taking: Reported on 07/06/2014) 30 tablet 3  . losartan (COZAAR) 50 MG tablet Take 1 tablet (50 mg  total) by mouth daily. (Patient not taking: Reported on 07/06/2014) 30 tablet 11  . nitroGLYCERIN (NITROSTAT) 0.4 MG SL tablet Place 1 tablet (0.4 mg total) under the tongue every 5 (five) minutes x 3 doses as needed for chest pain. (Patient not taking: Reported on 07/06/2014) 25 tablet 2   No current facility-administered medications for this visit.      Objective:    BP 176/88 mmHg  Pulse 79  Temp(Src) 97.6 F (36.4 C) (Oral)  Resp 18  Ht 5' 7.25" (1.708 m)  Wt 179 lb (81.194 kg)  BMI 27.83 kg/m2  SpO2 99% Physical Exam  Constitutional: He is oriented to person, place, and time. He appears well-developed and well-nourished. No distress.  HENT:  Head: Normocephalic and atraumatic.  Right Ear: External ear normal.  Left Ear: External ear normal.  Nose: Nose normal.  Mouth/Throat: Oropharynx is clear and moist.  Eyes: Conjunctivae and EOM are normal. Pupils are equal, round, and reactive to light.  Neck: Normal range of motion. Neck supple. Carotid bruit is not present. No thyromegaly present.  Cardiovascular: Normal rate, regular rhythm, normal heart sounds and intact distal pulses.  Exam reveals no gallop and no friction rub.   No murmur heard. Pulmonary/Chest: Effort normal and breath sounds normal. He has no wheezes. He has no rales.  Abdominal: Soft. Bowel sounds are normal. He exhibits no distension and no mass. There is no tenderness. There is no rebound and no guarding.  Musculoskeletal: Normal range of motion.  Lymphadenopathy:    He has no cervical adenopathy.  Neurological: He is alert and oriented to person, place, and time. He has normal strength. No cranial nerve deficit or sensory deficit. He exhibits normal muscle tone. He displays a negative Romberg sign. Coordination and gait normal.  Austin Mclaughlin reproduced mild dizziness B. No nystagmus.  Skin: Skin is warm and dry. No rash noted. He is not diaphoretic.  Psychiatric: He has a normal mood and affect. His  behavior is normal.  Nursing note and vitals reviewed.  Results for orders placed or performed in visit on 07/06/14  Comprehensive metabolic panel  Result Value Ref Range   Sodium 140 135 - 145 mEq/L   Potassium 3.5 3.5 - 5.3 mEq/L   Chloride 101 96 - 112 mEq/L   CO2 29 19 - 32 mEq/L   Glucose, Bld 207 (H) 70 - 99 mg/dL   BUN 13 6 - 23 mg/dL   Creat 4.090.99 8.110.50 - 9.141.35 mg/dL   Total Bilirubin 0.4 0.2 - 1.2 mg/dL   Alkaline Phosphatase 51 39 - 117 U/L   AST 20 0 - 37 U/L   ALT 19 0 - 53 U/L   Total Protein 7.4 6.0 - 8.3 g/dL   Albumin 4.2 3.5 - 5.2 g/dL   Calcium 78.210.0 8.4 - 95.610.5 mg/dL  POCT CBC  Result Value Ref Range   WBC 5.7 4.6 - 10.2 K/uL   Lymph, poc 2.2 0.6 - 3.4   POC LYMPH PERCENT 38.6 10 - 50 %L   MID (cbc) 0.4 0 - 0.9   POC  MID % 6.3 0 - 12 %M   POC Granulocyte 3.1 2 - 6.9   Granulocyte percent 55.1 37 - 80 %G   RBC 4.65 (A) 4.69 - 6.13 M/uL   Hemoglobin 14.1 14.1 - 18.1 g/dL   HCT, POC 16.143.2 (A) 09.643.5 - 53.7 %   MCV 92.9 80 - 97 fL   MCH, POC 30.3 27 - 31.2 pg   MCHC 32.7 31.8 - 35.4 g/dL   RDW, POC 04.513.5 %   Platelet Count, POC 263 142 - 424 K/uL   MPV 7.0 0 - 99.8 fL  POCT glucose (manual entry)  Result Value Ref Range   POC Glucose 212 (A) 70 - 99 mg/dl  POCT urinalysis dipstick  Result Value Ref Range   Color, UA yellow    Clarity, UA clear    Glucose, UA >=1000    Bilirubin, UA neg    Ketones, UA neg    Spec Grav, UA 1.010    Blood, UA trace-intact    pH, UA 6.5    Protein, UA neg    Urobilinogen, UA 1.0    Nitrite, UA neg    Leukocytes, UA Negative      EKG: NSR Assessment & Plan:   1. Dizziness   2. Type 2 diabetes mellitus without complication   3. Coronary artery disease involving native coronary artery of native heart without angina pectoris   4. Essential hypertension, benign     1. Dizziness; New. Onset one month ago. Etiology unclear.  Ddx includes benign positional vertigo, medication side effect, TIA/CVA.  Normal neurological exam.   Restart all medications.  If persists, consider carotid dopplers, MRI brain, neurology referral.   2.  DMII: elevated today; evaluated by PCP yesterday with glucose in 70s; Glipizide decreased to 1/2 dose.  Concern for hypoglycemia as cause of dizziness.  Now elevated sugars with persistent dizziness. 3.  CAD: stable; asymptomatic; start medications back today.   No orders of the defined types were placed in this encounter.    No Follow-up on file.   I personally performed the services described in this documentation, which was scribed in my presence. The recorded information has been reviewed and considered.   Sidnie Swalley Paulita FujitaMartin Lilyannah Zuelke, M.D. [facility]  [place] [address] [place], [place]  [id number] [phone] phone [phone] fax

## 2014-07-06 NOTE — Patient Instructions (Signed)
1.  Consider MRI brain and carotid dopplers in upcoming 1-2 weeks if dizziness persists.   2. Increase water intake daily.  Dizziness Dizziness is a common problem. It is a feeling of unsteadiness or light-headedness. You may feel like you are about to faint. Dizziness can lead to injury if you stumble or fall. A person of any age group can suffer from dizziness, but dizziness is more common in older adults. CAUSES  Dizziness can be caused by many different things, including:  Middle ear problems.  Standing for too long.  Infections.  An allergic reaction.  Aging.  An emotional response to something, such as the sight of blood.  Side effects of medicines.  Tiredness.  Problems with circulation or blood pressure.  Excessive use of alcohol or medicines, or illegal drug use.  Breathing too fast (hyperventilation).  An irregular heart rhythm (arrhythmia).  A low red blood cell count (anemia).  Pregnancy.  Vomiting, diarrhea, fever, or other illnesses that cause body fluid loss (dehydration).  Diseases or conditions such as Parkinson's disease, high blood pressure (hypertension), diabetes, and thyroid problems.  Exposure to extreme heat. DIAGNOSIS  Your health care provider will ask about your symptoms, perform a physical exam, and perform an electrocardiogram (ECG) to record the electrical activity of your heart. Your health care provider may also perform other heart or blood tests to determine the cause of your dizziness. These may include:  Transthoracic echocardiogram (TTE). During echocardiography, sound waves are used to evaluate how blood flows through your heart.  Transesophageal echocardiogram (TEE).  Cardiac monitoring. This allows your health care provider to monitor your heart rate and rhythm in real time.  Holter monitor. This is a portable device that records your heartbeat and can help diagnose heart arrhythmias. It allows your health care provider to track  your heart activity for several days if needed.  Stress tests by exercise or by giving medicine that makes the heart beat faster. TREATMENT  Treatment of dizziness depends on the cause of your symptoms and can vary greatly. HOME CARE INSTRUCTIONS   Drink enough fluids to keep your urine clear or pale yellow. This is especially important in very hot weather. In older adults, it is also important in cold weather.  Take your medicine exactly as directed if your dizziness is caused by medicines. When taking blood pressure medicines, it is especially important to get up slowly.  Rise slowly from chairs and steady yourself until you feel okay.  In the morning, first sit up on the side of the bed. When you feel okay, stand slowly while holding onto something until you know your balance is fine.  Move your legs often if you need to stand in one place for a long time. Tighten and relax your muscles in your legs while standing.  Have someone stay with you for 1-2 days if dizziness continues to be a problem. Do this until you feel you are well enough to stay alone. Have the person call your health care provider if he or she notices changes in you that are concerning.  Do not drive or use heavy machinery if you feel dizzy.  Do not drink alcohol. SEEK IMMEDIATE MEDICAL CARE IF:   Your dizziness or light-headedness gets worse.  You feel nauseous or vomit.  You have problems talking, walking, or using your arms, hands, or legs.  You feel weak.  You are not thinking clearly or you have trouble forming sentences. It may take a friend or family  member to notice this.  You have chest pain, abdominal pain, shortness of breath, or sweating.  Your vision changes.  You notice any bleeding.  You have side effects from medicine that seems to be getting worse rather than better. MAKE SURE YOU:   Understand these instructions.  Will watch your condition.  Will get help right away if you are not  doing well or get worse. Document Released: 12/17/2000 Document Revised: 06/28/2013 Document Reviewed: 01/10/2011 [facility] Patient Information 2015 [employer] [place]. This information is not intended to replace advice given to you by your health care provider. Make sure you discuss any questions you have with your health care provider.

## 2014-07-07 HISTORY — PX: NM MYOVIEW LTD: HXRAD82

## 2014-07-18 ENCOUNTER — Other Ambulatory Visit: Payer: Self-pay | Admitting: Internal Medicine

## 2014-07-18 DIAGNOSIS — Z720 Tobacco use: Secondary | ICD-10-CM

## 2014-07-18 DIAGNOSIS — R42 Dizziness and giddiness: Secondary | ICD-10-CM

## 2014-07-24 ENCOUNTER — Ambulatory Visit
Admission: RE | Admit: 2014-07-24 | Discharge: 2014-07-24 | Disposition: A | Discharge status: Home / Self Care | Payer: Medicare Other | Source: Ambulatory Visit | Attending: Internal Medicine | Admitting: Internal Medicine

## 2014-07-24 DIAGNOSIS — Z720 Tobacco use: Secondary | ICD-10-CM

## 2014-07-24 DIAGNOSIS — R42 Dizziness and giddiness: Secondary | ICD-10-CM

## 2014-08-01 ENCOUNTER — Telehealth: Payer: Self-pay | Admitting: Cardiology

## 2014-08-01 NOTE — Telephone Encounter (Signed)
Received records from Dr Ralene Okoy Moreira for appointment with Austin BoozerLaura Ingold, NP on 08/10/14.  Records given to  [name] (medical records) for Laura's schedule on 08/10/14.  lp

## 2014-08-10 ENCOUNTER — Ambulatory Visit (INDEPENDENT_AMBULATORY_CARE_PROVIDER_SITE_OTHER): Payer: Medicare Other | Admitting: Cardiology

## 2014-08-10 ENCOUNTER — Encounter: Payer: Self-pay | Admitting: Cardiology

## 2014-08-10 VITALS — BP 156/74 | HR 66 | Ht 66.0 in | Wt 184.3 lb

## 2014-08-10 DIAGNOSIS — R42 Dizziness and giddiness: Secondary | ICD-10-CM

## 2014-08-10 DIAGNOSIS — I2510 Atherosclerotic heart disease of native coronary artery without angina pectoris: Secondary | ICD-10-CM

## 2014-08-10 NOTE — Patient Instructions (Signed)
Your physician recommends that you schedule a follow-up appointment in: 6 Weeks with Dr Allyson SabalBerry  Your physician has requested that you have en exercise stress myoview. For further information please visit [email or web address]. Please follow instruction sheet, as given.

## 2014-08-10 NOTE — Progress Notes (Signed)
Cardiology Office Note   Date:  08/10/2014   ID:  Austin Mclaughlin, DOB 11/19/1945, MRN [id number]  PCP:  Ralene Ok, MD  Cardiologist:  Dr. Erlene Quan     Chief Complaint  Patient presents with  . Coronary Artery Disease    calcification on CT, pt c/o dizziness,       History of Present Illness: Austin Mclaughlin is a 69 y.o. male who presents for eval of CAD after dizziness and 3 vessel CAD on regular CT angio of chest: Atherosclerosis, including left main and 3 vessel coronary artery disease. , also CT of head: Atherosclerotic calcifications at the carotid siphons .   Pt's dizziness has improved.  68y.o. Married [place] American male, father of 4,with a history of CAD, s/p cath in 2008 after a stress test for CP showed ischemia. The cath, at that time, demonstrated an occluded PDA and collaterals with high grade OM2 disease and proximal segmental AV groove left circ disease. He underwent PCI of the AV groove left circ and OM2 with DES. He had reportedly done well until 2014 and EKG with  STEMI was called and he was was taken urgently to the cath lab. The procedure was performed by Dr. Allyson Sabal via the right femoral artery. It demonstrated a patent LAD stent and diffuse RCA disease. Medical therapy was recommended. Pt today has no chest pain and no SOB only his dizziness.  He has been active with work on [employer].   He also has stopped smoking 1 month ago after smoking one pack per day for 55 years.  He was unclear on some of his medications he did know she was taking atorvastatin versus simvastatin. Additionally he is not taking some of his medications.  Past Medical History  Diagnosis Date  . Hypertension   . Coronary artery disease   . High cholesterol   . Myocardial infarction ~ 2007  . Type II diabetes mellitus   . GERD (gastroesophageal reflux disease)   . Arthritis     "all over" (02/17/2013)  . Chronic lower back pain     Past Surgical History  Procedure Laterality Date  .  Coronary artery bypass graft    . Coronary angioplasty with stent placement  ~ 2007    "2 stents" (02/17/2013)  . Cardiac catheterization  02/15/2013     Current Outpatient Prescriptions  Medication Sig Dispense Refill  . aspirin EC 81 MG EC tablet Take 1 tablet (81 mg total) by mouth daily.    Marland Kitchen atorvastatin (LIPITOR) 40 MG tablet Take 1 tablet (40 mg total) by mouth daily. 30 tablet 3  . clopidogrel (PLAVIX) 75 MG tablet Take 75 mg by mouth daily.    Marland Kitchen glimepiride (AMARYL) 2 MG tablet Take 2 mg by mouth daily.    . insulin glargine (LANTUS) 100 UNIT/ML injection Inject 36 Units into the skin daily.     Marland Kitchen losartan (COZAAR) 50 MG tablet Take 1 tablet (50 mg total) by mouth daily. 30 tablet 11  . metFORMIN (GLUCOPHAGE-XR) 500 MG 24 hr tablet Take 500 mg by mouth 2 (two) times daily. Pt. States he only taking once a day cause it makes him dizzy    . metoprolol tartrate (LOPRESSOR) 25 MG tablet Take 1 tablet (25 mg total) by mouth 2 (two) times daily. 60 tablet 11  . nitroGLYCERIN (NITROSTAT) 0.4 MG SL tablet Place 1 tablet (0.4 mg total) under the tongue every 5 (five) minutes x 3 doses as needed for chest pain. 25  tablet 2  . pantoprazole (PROTONIX) 40 MG tablet Take 40 mg by mouth daily.    . simvastatin (ZOCOR) 80 MG tablet Take 1 tablet by mouth daily.  11  . hydrochlorothiazide (HYDRODIURIL) 25 MG tablet Take 1 tablet (25 mg total) by mouth daily. PT MUST KEEP APPOINTMENT 04/26/2014 FOR FUTURE REFILLS. 30 tablet 1   No current facility-administered medications for this visit.    Allergies:   Review of patient's allergies indicates no known allergies.    Social History:  The patient  reports that he quit smoking about 3 months ago. His smoking use included Cigarettes. He has a 39 pack-year smoking history. He has never used smokeless tobacco. He reports that he does not drink alcohol or use illicit drugs.   Family History:  The patient's family history is not on file.    ROS:   General:no colds or fevers, no weight changes Skin:no rashes or ulcers HEENT:no blurred vision, no congestion CV:see HPI PUL:see HPI GI:no diarrhea constipation or melena, no indigestion GU:no hematuria, no dysuria MS:no joint pain, no claudication Neuro:no syncope, + lightheadedness recently seen by PCP and it has improved. Endo:+ diabetes, no thyroid disease   Wt Readings from Last 3 Encounters:  08/10/14 184 lb 4.8 oz (83.598 kg)  07/06/14 179 lb (81.194 kg)  05/30/14 182 lb (82.555 kg)     PHYSICAL EXAM: VS:  BP 156/74 mmHg  Pulse 66  Ht 5\' 6"  (1.676 m)  Wt 184 lb 4.8 oz (83.598 kg)  BMI 29.76 kg/m2 , BMI Body mass index is 29.76 kg/(m^2). General:no colds or fevers, no weight changes Skin:no rashes or ulcers HEENT:no blurred vision, no congestion CV:see HPI PUL:see HPI GI:no diarrhea constipation or melena, no indigestion GU:no hematuria, no dysuria MS:no joint pain, no claudication Neuro:no syncope, no lightheadedness Endo:no diabetes, no thyroid disease  EKG:  EKG is not ordered today.    Recent Labs: 02/05/2014: Platelets 253 07/06/2014: ALT 19; BUN 13; Creatinine 0.99; Hemoglobin 14.1; Potassium 3.5; Sodium 140    Lipid Panel    Component Value Date/Time   CHOL 185 02/16/2013 0245   TRIG 171* 02/16/2013 0245   HDL 40 02/16/2013 0245   CHOLHDL 4.6 02/16/2013 0245   VLDL 34 02/16/2013 0245   LDLCALC 111* 02/16/2013 0245     From PCP; LDL 94 and HDL 39   Other studies Reviewed: Additional studies/ records that were reviewed today include: as above. arterial dopplers: 1. Bilateral moderate lower extremity arterial occlusive disease at rest, probably primarily femoro-popliteal. Should the patient fail conservative treatment, consider CTA runoff (higher spatial resolution) or MRA runoff (no radiation risk, can be performed noncontrast in the setting of renal dysfunction) to better define the site and nature of arterial occlusive disease and  delineate treatment options.   ASSESSMENT AND PLAN:  1.  Three-vessel  CAD-disease on CT anterior of the chest. With known coronary artery disease. While he has no chest pain concern for increasing amount of coronary disease we'll proceed with exercise Myoview for further evaluation.  He will follow with Dr. Allyson SabalBerry in 6 weeks unless the test is positive at which time further decisions will be made and patient will be directed.  2. CAD (coronary artery disease) History of CAD status post circumflex stenting by myself in the proximal AV groove as well as OM 2 back in 2008. He was admitted in 2014 Repeat cardiac catheterization performed on 02/16/13 revealed patent stents with a 60% proximal segmental OM1 branch stenosis and high-grade distal  nondominant RCA disease normal LV function. Medical therapy was recommended. He has had no recurrent chest pain. He remains on dual antiplatelet therapy.  Hyperlipidemia On statin therapy followed by his PCP as noted  HTN (hypertension) Poorly controlled today though he admits to not taking medications as he is supposed to. Patient will let us know what he is actually taking when he goes home and looks at the bottles. We will adjust meds accordingly.   Current medicines are reviewed at length with the patient today.  The patient has no  concerns regarding medicines. Though I do-he will call us and tell us exactly what medications he is taking  The following changes have been made:  See above  Labs/ tests ordered today include:see above  Disposition:   FU:  see above  Nyoka Lint, NP  08/10/2014 7:40 PM    [facility] [address] [place], [place], [place]  [address]/ [address] [place], [place] Phone: [phone]; Fax: [phone]  [phone]

## 2014-08-18 ENCOUNTER — Telehealth (HOSPITAL_COMMUNITY): Payer: Self-pay

## 2014-08-18 NOTE — Telephone Encounter (Signed)
Encounter complete. 

## 2014-08-23 ENCOUNTER — Ambulatory Visit (HOSPITAL_COMMUNITY)
Admission: RE | Admit: 2014-08-23 | Discharge: 2014-08-23 | Disposition: A | Discharge status: Home / Self Care | Payer: Medicare Other | Source: Ambulatory Visit | Attending: Cardiovascular Disease | Admitting: Cardiovascular Disease

## 2014-08-23 DIAGNOSIS — I2510 Atherosclerotic heart disease of native coronary artery without angina pectoris: Secondary | ICD-10-CM | POA: Insufficient documentation

## 2014-08-23 DIAGNOSIS — R42 Dizziness and giddiness: Secondary | ICD-10-CM

## 2014-08-23 MED ORDER — TECHNETIUM TC 99M SESTAMIBI GENERIC - CARDIOLITE
31.2000 | Freq: Once | INTRAVENOUS | Status: AC | PRN
  Administered 2014-08-23: 31.2 via INTRAVENOUS

## 2014-08-23 MED ORDER — TECHNETIUM TC 99M SESTAMIBI GENERIC - CARDIOLITE
10.8000 | Freq: Once | INTRAVENOUS | Status: AC | PRN
  Administered 2014-08-23: 11 via INTRAVENOUS

## 2014-08-23 NOTE — Procedures (Addendum)
[facility] [address] [place] [place] [place] [address] [phone]  Cardiology Nuclear Med Study  [name] is a 69 y.o. male     MRN : [id number]     DOB: 1945-12-16  Procedure Date: 08/23/2014  Nuclear Med Background Indication for Stress Test:  Evaluation for Ischemia and Follow up CAD History:  CAD;MI;STENTS/PTCA;Last NUC MPI EF=54% Cardiac Risk Factors: Hypertension, IDDM Type 2, Lipids, Overweight, Smoker and Calcification on CT  Symptoms:  Dizziness, Light-Headedness and Near Syncope   Nuclear Pre-Procedure Caffeine/Decaff Intake:  7:00pm NPO After: 5:00am   IV Site: R Forearm  IV 0.9% NS with Angio Cath:  22g  Chest Size (in):  46" IV Started by: Berdie OgrenAmanda Wease, RN  Height: 5\' 6"  (1.676 m)  Cup Size: n/a  BMI:  Body mass index is 29.71 kg/(m^2). Weight:  184 lb (83.462 kg)   Tech Comments:  n/a    Nuclear Med Study 1 or 2 day study: 1 day  Stress Test Type:  Stress  Order Authorizing Provider:  Nanetta BattyJonathan Berry, MD   Resting Radionuclide: Technetium 1863m Sestamibi  Resting Radionuclide Dose: 10.8 mCi   Stress Radionuclide:  Technetium 4063m Sestamibi  Stress Radionuclide Dose: 31.2 mCi           Stress Protocol Rest HR: 88 Stress HR: 173  Rest BP: 157/79 Stress BP: 197/64  Exercise Time (min): 7 METS: 8.5   Predicted Max HR: 150 bpm % Max HR: 115.33 bpm Rate Pressure Product: [id number]  Dose of Adenosine (mg):  n/a Dose of Lexiscan: n/a mg  Dose of Atropine (mg): n/a Dose of Dobutamine: n/a mcg/kg/min (at max HR)  Stress Test Technologist: Esperanza Sheetserry-Marie Martin, CCT Nuclear Technologist: Gonzella LexPam Phillips, CNMT   Rest Procedure:  Myocardial perfusion imaging was performed at rest 45 minutes following the intravenous administration of Technetium 6563m Sestamibi. Stress Procedure:  The patient performed treadmill exercise using a Bruce  Protocol for 7 minutes. The patient stopped due to Leg Fatigue and tightness, THR obtained and  denied any chest pain.  There were no significant ST-T wave changes.  Technetium 8263m Sestamibi was injected IV at peak exercise and myocardial perfusion imaging was performed after a brief delay.  Transient Ischemic Dilatation (Normal <1.22):  0.89  QGS EDV:  92 ml QGS ESV:  38 ml LV Ejection Fraction: 59%      Rest ECG: NSR - Normal EKG  Stress ECG: No significant change from baseline ECG and There are scattered PVCs.  QPS Raw Data Images:  Normal; no motion artifact; normal heart/lung ratio. Stress Images:  Normal homogeneous uptake in all areas of the myocardium. Rest Images:  Normal homogeneous uptake in all areas of the myocardium. Subtraction (SDS):  No evidence of ischemia. LV Wall Motion:  NL LV Function; NL Wall Motion  Impression Exercise Capacity:  Good exercise capacity. BP Response:  Normal blood pressure response. Clinical Symptoms:  No significant symptoms noted. ECG Impression:  No significant ST segment change suggestive of ischemia. Comparison with Prior Nuclear Study: No significant change from previous study   Overall Impression:  Normal stress nuclear study.   Thurmon FairROITORU,Austin Deupree, MD  08/23/2014 3:56 PM

## 2014-09-26 ENCOUNTER — Ambulatory Visit (INDEPENDENT_AMBULATORY_CARE_PROVIDER_SITE_OTHER): Payer: Medicare Other | Admitting: Cardiovascular Disease

## 2014-09-26 ENCOUNTER — Encounter: Payer: Self-pay | Admitting: Cardiovascular Disease

## 2014-09-26 VITALS — BP 116/78 | HR 72 | Ht 64.0 in | Wt 180.8 lb

## 2014-09-26 DIAGNOSIS — I10 Essential (primary) hypertension: Secondary | ICD-10-CM

## 2014-09-26 DIAGNOSIS — I2583 Coronary atherosclerosis due to lipid rich plaque: Secondary | ICD-10-CM

## 2014-09-26 DIAGNOSIS — Z720 Tobacco use: Secondary | ICD-10-CM

## 2014-09-26 DIAGNOSIS — I2510 Atherosclerotic heart disease of native coronary artery without angina pectoris: Secondary | ICD-10-CM

## 2014-09-26 DIAGNOSIS — E785 Hyperlipidemia, unspecified: Secondary | ICD-10-CM

## 2014-09-26 NOTE — Progress Notes (Signed)
09/26/2014 Austin Mclaughlin   Nov 12, 1945  [id number]  Primary Physician Ralene Ok, MD Primary Cardiologist: [name] MD Roseanne Reno   HPI: [name] is a 68y.o. Married [place] American male, father of 4,with a history of CAD, s/p cath in 2008 after a stress test for CP showed ischemia. The cath, at that time, demonstrated an occluded PDA and collaterals with high grade OM2 disease and proximal segmental AV groove left circ disease. He underwent PCI of the AV groove left circ and OM2 with DES. He had reportedly done well until developing chest pain the day prior to arrival to the ED. The CP had been intermittent for over the course of 24 hours, before recurring and prompting the patient to seek evaluation. He described the pain as a sharp, stinging pain in his left chest with no SOB, nausea or diaphoresis. At the [facility] ER he was noted to have 4mm of ST elevation in V1 and V2 and a STEMI was called and he was transferred to [facility]. On arrival to [facility], his chest pain resolved. He was taken urgently to the cath lab. The procedure was performed by Dr. Allyson Sabal via the right femoral artery. It demonstrated a patent LAD stent and diffuse RCA disease. Medical therapy was recommended. He left the cath lab in stable condition. Post-cath, he was noted to have moderate hypertension. He was placed on Lopressor, HCTZ and lisinopril. He had mild improvement in BP. The patient saw H[facility] back in the office the Monday after discharge and his lisinopril was up titrated. Since that time he's had no chest pain and for some reason no longer is on an ACE inhibitor. He also has stopped smoking several months ago after smoking one pack per day for 55 years. He saw Nada Boozer in the office 08/10/14. A Myoview stress test performed 2 weeks later was entirely normal. He remains asymptomatic.   Current Outpatient Prescriptions  Medication Sig Dispense Refill  . aspirin EC 81 MG EC tablet Take 1  tablet (81 mg total) by mouth daily.    Marland Kitchen atorvastatin (LIPITOR) 40 MG tablet Take 1 tablet (40 mg total) by mouth daily. 30 tablet 3  . clopidogrel (PLAVIX) 75 MG tablet Take 75 mg by mouth daily.    Marland Kitchen glimepiride (AMARYL) 2 MG tablet Take 2 mg by mouth daily.    . hydrochlorothiazide (HYDRODIURIL) 25 MG tablet Take 1 tablet (25 mg total) by mouth daily. PT MUST KEEP APPOINTMENT 04/26/2014 FOR FUTURE REFILLS. 30 tablet 1  . insulin glargine (LANTUS) 100 UNIT/ML injection Inject 36 Units into the skin daily.     Marland Kitchen losartan (COZAAR) 50 MG tablet Take 1 tablet (50 mg total) by mouth daily. 30 tablet 11  . metFORMIN (GLUCOPHAGE-XR) 500 MG 24 hr tablet Take 500 mg by mouth 2 (two) times daily. Pt. States he only taking once a day cause it makes him dizzy    . metoprolol tartrate (LOPRESSOR) 25 MG tablet Take 1 tablet (25 mg total) by mouth 2 (two) times daily. 60 tablet 11  . nitroGLYCERIN (NITROSTAT) 0.4 MG SL tablet Place 1 tablet (0.4 mg total) under the tongue every 5 (five) minutes x 3 doses as needed for chest pain. 25 tablet 2  . pantoprazole (PROTONIX) 40 MG tablet Take 40 mg by mouth daily.    . simvastatin (ZOCOR) 80 MG tablet Take 1 tablet by mouth daily.  11   No current facility-administered medications for this visit.  No Known Allergies  History   Social History  . Marital Status: Married    Spouse Name: [name]  . Number of Children: [name]  . Years of Education: [name]   Occupational History  . Not on file.   Social History Main Topics  . Smoking status: Former Smoker -- 1.00 packs/day for 39 years    Types: Cigarettes    Quit date: 04/17/2014  . Smokeless tobacco: Never Used     Comment: every once in a while  . Alcohol Use: 8.4 oz/week    12 Cans of beer, 2 Shots of liquor per week     Comment: 02/17/2013 "6 beers/weekend day"  . Drug Use: No  . Sexual Activity: Yes   Other Topics Concern  . Not on file   Social History Narrative     Review of Systems: General:  negative for chills, fever, night sweats or weight changes.  Cardiovascular: negative for chest pain, dyspnea on exertion, edema, orthopnea, palpitations, paroxysmal nocturnal dyspnea or shortness of breath Dermatological: negative for rash Respiratory: negative for cough or wheezing Urologic: negative for hematuria Abdominal: negative for nausea, vomiting, diarrhea, bright red blood per rectum, melena, or hematemesis Neurologic: negative for visual changes, syncope, or dizziness All other systems reviewed and are otherwise negative except as noted above.    Blood pressure 116/78, pulse 72, height 5\' 4"  (1.626 m), weight 180 lb 12.8 oz (82.01 kg).  General appearance: alert and no distress Neck: no adenopathy, no carotid bruit, no JVD, supple, symmetrical, trachea midline and thyroid not enlarged, symmetric, no tenderness/mass/nodules Lungs: clear to auscultation bilaterally Heart: regular rate and rhythm, S1, S2 normal, no murmur, click, rub or gallop Extremities: extremities normal, atraumatic, no cyanosis or edema  EKG not performed today  ASSESSMENT AND PLAN:   Tobacco abuse Stopped smoking 2 months ago   Hyperlipidemia History of hyperlipidemia on atorvastatin 40 mg a day followed by his PCP   HTN (hypertension) History of hypertension blood pressure measured 116/78. He is on hydrochlorothiazide and losartan as well as metoprolol. Continue current meds at current dosing   CAD (coronary artery disease) History of coronary artery disease status post cardiac catheterization in 2008 after an abnormal Myoview that revealed an occluded PDA with collaterals with high-grade OM 2 and proximal segmental AV groove circumflex disease. He underwent PCI of his AV groove circumflex and OM 2 with drug-eluting stents. He did recently have chest pain with EKG changes which were called "STEMI and underwent cardiac catheterization by myself revealing a patent LAD stent and diffuse RCA disease.  Medical therapy was recommended. He had a Myoview stress test performed 08/23/14 which was low risk and nonischemic. He is currently a symptomatic.       Runell GessJonathan J. Shanta Hartner MD FACP,FACC,FAHA, [facility] 09/26/2014 12:17 PM

## 2014-09-26 NOTE — Patient Instructions (Signed)
Dr Berry recommends that you schedule a follow-up appointment in 6 months with an extender - [name], NP.  Dr Berry wants you to follow-up in 12 months. You will receive a reminder letter in the mail two months in advance. If you don't receive a letter, please call our office to schedule the follow-up appointment. 

## 2014-09-26 NOTE — Assessment & Plan Note (Signed)
History of hyperlipidemia on atorvastatin 40 mg a day followed by his PCP 

## 2014-09-26 NOTE — Assessment & Plan Note (Signed)
History of coronary artery disease status post cardiac catheterization in 2008 after an abnormal Myoview that revealed an occluded PDA with collaterals with high-grade OM 2 and proximal segmental AV groove circumflex disease. He underwent PCI of his AV groove circumflex and OM 2 with drug-eluting stents. He did recently have chest pain with EKG changes which were called "STEMI and underwent cardiac catheterization by myself revealing a patent LAD stent and diffuse RCA disease. Medical therapy was recommended. He had a Myoview stress test performed 08/23/14 which was low risk and nonischemic. He is currently a symptomatic.

## 2014-09-26 NOTE — Assessment & Plan Note (Signed)
Stopped smoking 2 months ago.

## 2014-09-26 NOTE — Assessment & Plan Note (Signed)
History of hypertension blood pressure measured 116/78. He is on hydrochlorothiazide and losartan as well as metoprolol. Continue current meds at current dosing

## 2014-11-06 ENCOUNTER — Encounter: Payer: Self-pay | Admitting: *Deleted

## 2014-11-30 ENCOUNTER — Encounter: Payer: Self-pay | Admitting: Cardiovascular Disease

## 2014-11-30 ENCOUNTER — Encounter: Payer: Self-pay | Admitting: Internal Medicine

## 2015-03-27 ENCOUNTER — Encounter: Payer: Self-pay | Admitting: Cardiovascular Disease

## 2015-11-12 ENCOUNTER — Emergency Department (HOSPITAL_COMMUNITY)
Admission: EM | Admit: 2015-11-12 | Discharge: 2015-11-12 | Disposition: A | Discharge status: Home / Self Care | Payer: Medicare Other | Attending: Emergency Medicine | Admitting: Emergency Medicine

## 2015-11-12 ENCOUNTER — Encounter (HOSPITAL_COMMUNITY): Payer: Self-pay

## 2015-11-12 ENCOUNTER — Emergency Department (HOSPITAL_COMMUNITY): Payer: Medicare Other

## 2015-11-12 DIAGNOSIS — E876 Hypokalemia: Secondary | ICD-10-CM | POA: Insufficient documentation

## 2015-11-12 DIAGNOSIS — I252 Old myocardial infarction: Secondary | ICD-10-CM | POA: Diagnosis not present

## 2015-11-12 DIAGNOSIS — Z951 Presence of aortocoronary bypass graft: Secondary | ICD-10-CM | POA: Diagnosis not present

## 2015-11-12 DIAGNOSIS — E119 Type 2 diabetes mellitus without complications: Secondary | ICD-10-CM | POA: Insufficient documentation

## 2015-11-12 DIAGNOSIS — M1990 Unspecified osteoarthritis, unspecified site: Secondary | ICD-10-CM | POA: Insufficient documentation

## 2015-11-12 DIAGNOSIS — E7800 Pure hypercholesterolemia, unspecified: Secondary | ICD-10-CM | POA: Diagnosis not present

## 2015-11-12 DIAGNOSIS — Z79899 Other long term (current) drug therapy: Secondary | ICD-10-CM | POA: Insufficient documentation

## 2015-11-12 DIAGNOSIS — R002 Palpitations: Secondary | ICD-10-CM | POA: Diagnosis not present

## 2015-11-12 DIAGNOSIS — I10 Essential (primary) hypertension: Secondary | ICD-10-CM | POA: Diagnosis not present

## 2015-11-12 DIAGNOSIS — Z7982 Long term (current) use of aspirin: Secondary | ICD-10-CM | POA: Diagnosis not present

## 2015-11-12 DIAGNOSIS — R000 Tachycardia, unspecified: Secondary | ICD-10-CM | POA: Diagnosis present

## 2015-11-12 DIAGNOSIS — Z794 Long term (current) use of insulin: Secondary | ICD-10-CM | POA: Diagnosis not present

## 2015-11-12 DIAGNOSIS — F17210 Nicotine dependence, cigarettes, uncomplicated: Secondary | ICD-10-CM | POA: Diagnosis not present

## 2015-11-12 DIAGNOSIS — Z7984 Long term (current) use of oral hypoglycemic drugs: Secondary | ICD-10-CM | POA: Diagnosis not present

## 2015-11-12 DIAGNOSIS — K219 Gastro-esophageal reflux disease without esophagitis: Secondary | ICD-10-CM | POA: Insufficient documentation

## 2015-11-12 DIAGNOSIS — I2510 Atherosclerotic heart disease of native coronary artery without angina pectoris: Secondary | ICD-10-CM | POA: Insufficient documentation

## 2015-11-12 LAB — BASIC METABOLIC PANEL
ANION GAP: 10 (ref 5–15)
BUN: 17 mg/dL (ref 6–20)
CO2: 27 mmol/L (ref 22–32)
Calcium: 9.3 mg/dL (ref 8.9–10.3)
Chloride: 104 mmol/L (ref 101–111)
Creatinine, Ser: 0.73 mg/dL (ref 0.61–1.24)
GFR calc non Af Amer: >60 mL/min (ref >60)
Glucose, Bld: 156 mg/dL — ABNORMAL HIGH (ref 65–99)
POTASSIUM: 2.9 mmol/L — AB (ref 3.5–5.1)
SODIUM: 141 mmol/L (ref 135–145)

## 2015-11-12 LAB — CBC
HCT: 35.9 % — ABNORMAL LOW (ref 39.0–52.0)
HEMOGLOBIN: 13.2 g/dL (ref 13.0–17.0)
MCH: 32.9 pg (ref 26.0–34.0)
MCHC: 36.8 g/dL — AB (ref 30.0–36.0)
MCV: 89.5 fL (ref 78.0–100.0)
Platelets: 298 10*3/uL (ref 150–400)
RBC: 4.01 MIL/uL — AB (ref 4.22–5.81)
RDW: 12.7 % (ref 11.5–15.5)
WBC: 4.6 10*3/uL (ref 4.0–10.5)

## 2015-11-12 LAB — I-STAT TROPONIN, ED: Troponin i, poc: 0 ng/mL (ref 0.00–0.08)

## 2015-11-12 LAB — MAGNESIUM: MAGNESIUM: 1.4 mg/dL — AB (ref 1.7–2.4)

## 2015-11-12 LAB — TROPONIN I

## 2015-11-12 LAB — CBG MONITORING, ED: GLUCOSE-CAPILLARY: 159 mg/dL — AB (ref 65–99)

## 2015-11-12 MED ORDER — POTASSIUM CHLORIDE CRYS ER 20 MEQ PO TBCR
40.0000 meq | EXTENDED_RELEASE_TABLET | Freq: Once | ORAL | Status: AC
  Administered 2015-11-12: 40 meq via ORAL
  Filled 2015-11-12: qty 2

## 2015-11-12 MED ORDER — MAGNESIUM OXIDE 400 (241.3 MG) MG PO TABS
400.0000 mg | ORAL_TABLET | Freq: Once | ORAL | Status: AC
  Administered 2015-11-12: 400 mg via ORAL
  Filled 2015-11-12: qty 1

## 2015-11-12 NOTE — Discharge Instructions (Signed)
Follow up with your cardiologist this week in regard's to today's visit.  Return to ER for new or worsening symptoms, any additional concerns.

## 2015-11-12 NOTE — ED Notes (Signed)
Pt states that he "feels like his heart is racing." Pt has a hx of 2 cardiac stent placements and a CABG. Pt states that this feeling started after he woke up with low sugar and ate some peanut butter. A&Ox4. Ambulated into triage. Sinus tach on the monitor in triage. Denies pain, SOB, nausea and vomiting.

## 2015-11-12 NOTE — ED Provider Notes (Signed)
CSN: [id number]     Arrival date & time 11/12/15  0248 History   First MD Initiated Contact with Patient 11/12/15 0308     Chief Complaint  Patient presents with  . Tachycardia    (Consider location/radiation/quality/duration/timing/severity/associated sxs/prior Treatment) The history is provided by the patient and medical records. No language interpreter was used.   Austin Mclaughlin is a 70 y.o. male  with a PMH of HTN, CAD, prior CABG, DM2 who presents to the Emergency Department complaining of waking from his sleep just PTA feeling as if his heart was racing. He believed that his sugar was low, so he ate but her and felt mild improvement. Due to his cardiac history, came straight to the emergency department for further evaluation. He denies chest pain or pressure. Denies shortness of breath, nausea, diaphoresis, abdominal pain, back pain. At this time symptoms have resolved. Taking medications as directed; no medications taken PTA for symptoms.   Past Medical History  Diagnosis Date  . Hypertension   . Coronary artery disease   . High cholesterol   . Myocardial infarction (HCC) ~ 2007  . Type II diabetes mellitus (HCC)   . GERD (gastroesophageal reflux disease)   . Arthritis     "all over" (02/17/2013)  . Chronic lower back pain    Past Surgical History  Procedure Laterality Date  . Coronary artery bypass graft    . Coronary angioplasty with stent placement  ~ 2007    "2 stents" (02/17/2013)  . Cardiac catheterization  02/15/2013/2008  . Nm myoview ltd  2008   History reviewed. No pertinent family history. Social History  Substance Use Topics  . Smoking status: Former Smoker -- 1.00 packs/day for 39 years    Types: Cigarettes    Quit date: 04/17/2014  . Smokeless tobacco: Never Used     Comment: every once in a while  . Alcohol Use: 8.4 oz/week    12 Cans of beer, 2 Shots of liquor per week     Comment: 02/17/2013 "6 beers/weekend day"    Review of Systems  Constitutional:  Negative for fever and diaphoresis.  HENT: Negative for congestion and sore throat.   Eyes: Negative for visual disturbance.  Respiratory: Negative for cough and shortness of breath.   Cardiovascular: Positive for palpitations. Negative for chest pain and leg swelling.  Gastrointestinal: Negative for nausea, vomiting and abdominal pain.  Genitourinary: Negative for dysuria.  Musculoskeletal: Negative for back pain.  Skin: Negative for rash.  Neurological: Negative for dizziness, weakness and headaches.      Allergies  Review of patient's allergies indicates no known allergies.  Home Medications   Prior to Admission medications   Medication Sig Start Date End Date Taking? Authorizing Provider  amLODipine-benazepril (LOTREL) 10-40 MG capsule Take 1 capsule by mouth daily. 10/10/15  Yes Historical Provider, MD  aspirin EC 81 MG EC tablet Take 1 tablet (81 mg total) by mouth daily. 02/17/13  Yes Brittainy Sherlynn CarbonM Simmons, PA-C  atorvastatin (LIPITOR) 40 MG tablet Take 1 tablet (40 mg total) by mouth daily. 05/29/14  Yes Runell GessJonathan J Berry, MD  clopidogrel (PLAVIX) 75 MG tablet Take 75 mg by mouth daily.   Yes Historical Provider, MD  glimepiride (AMARYL) 2 MG tablet Take 2 mg by mouth daily.   Yes Historical Provider, MD  insulin glargine (LANTUS) 100 UNIT/ML injection Inject 36 Units into the skin daily.    Yes Historical Provider, MD  metFORMIN (GLUCOPHAGE) 500 MG tablet Take 500 mg by mouth  2 (two) times daily. 11/07/15  Yes Historical Provider, MD  metoprolol tartrate (LOPRESSOR) 25 MG tablet Take 1 tablet (25 mg total) by mouth 2 (two) times daily. 05/16/14  Yes [name], MD  nitroGLYCERIN (NITROSTAT) 0.4 MG SL tablet Place 1 tablet (0.4 mg total) under the tongue every 5 (five) minutes x 3 doses as needed for chest pain. 02/17/13  Yes Brittainy Sherlynn Carbon, PA-C  pantoprazole (PROTONIX) 40 MG tablet Take 40 mg by mouth daily.   Yes Historical Provider, MD  simvastatin (ZOCOR) 80 MG tablet  Take 1 tablet by mouth daily. 07/12/14  Yes Historical Provider, MD  hydrochlorothiazide (HYDRODIURIL) 25 MG tablet Take 1 tablet by mouth daily. 11/05/15   Historical Provider, MD   BP 158/73 mmHg  Pulse 102  Temp(Src) 98.2 F (36.8 C)  Resp 15  SpO2 98% Physical Exam  Constitutional: He is oriented to person, place, and time. He appears well-developed and well-nourished.  Alert and in no acute distress  HENT:  Head: Normocephalic and atraumatic.  Cardiovascular: Regular rhythm, normal heart sounds and intact distal pulses.  Exam reveals no gallop and no friction rub.   No murmur heard. Tachycardia.   Pulmonary/Chest: Effort normal and breath sounds normal. No respiratory distress. He has no wheezes. He has no rales. He exhibits no tenderness.  Abdominal: Soft. Bowel sounds are normal. He exhibits no distension and no mass. There is no tenderness. There is no rebound and no guarding.  Musculoskeletal: He exhibits no edema.  Neurological: He is alert and oriented to person, place, and time.  Skin: Skin is warm and dry.  Nursing note and vitals reviewed.   ED Course  Procedures (including critical care time) Labs Review Labs Reviewed  CBC - Abnormal; Notable for the following:    RBC 4.01 (*)    HCT 35.9 (*)    MCHC 36.8 (*)    All other components within normal limits  BASIC METABOLIC PANEL - Abnormal; Notable for the following:    Potassium 2.9 (*)    Glucose, Bld 156 (*)    All other components within normal limits  MAGNESIUM - Abnormal; Notable for the following:    Magnesium 1.4 (*)    All other components within normal limits  CBG MONITORING, ED - Abnormal; Notable for the following:    Glucose-Capillary 159 (*)    All other components within normal limits  TROPONIN I  Rosezena Sensor, ED    Imaging Review Dg Chest 2 View  11/12/2015  CLINICAL DATA:  Hypoglycemia.  Tachycardia. EXAM: CHEST  2 VIEW COMPARISON:  02/15/2013 FINDINGS: The lungs are clear. The pulmonary  vasculature is normal. Heart size is normal. Hilar and mediastinal contours are unremarkable. There is no pleural effusion. IMPRESSION: No active cardiopulmonary disease. Electronically Signed   By: Ellery Plunk M.D.   On: 11/12/2015 04:13   I have personally reviewed and evaluated these images and lab results as part of my medical decision-making.   EKG Interpretation   Date/Time:  Monday Nov 12 2015 02:56:06 EDT Ventricular Rate:  101 PR Interval:  182 QRS Duration: 89 QT Interval:  346 QTC Calculation: 448 R Axis:   37 Text Interpretation:  Sinus tachycardia Probable left atrial enlargement  Abnormal R-wave progression, early transition Nonspecific T abnormalities,  lateral leads No significant change was found Confirmed by CAMPOS  MD,  KEVIN ([id number]) on 11/12/2015 3:05:44 AM      MDM   Final diagnoses:  Palpitations  Hypokalemia  Hypomagnesemia  Austin Mclaughlin presents to ED for palpitations that awoke him from his sleep just prior to arrival. Denies chest pain, sob, nausea, diaphoresis, LOC. No hx of afib or aflutter.   Labs: CMP with K+ of 2.9, mag 1.4 - 40 meq K+ and 400 mg mag given. Troponin 0.0.  Imaging: CXR unremarkable EKG reviewed with attending, Dr. Patria Mane  A&P: Palpitations, hypokalemia, hypomag. Elytes replaced in ED. Patient to follow up with cardiologist this week and appears reliable to do so. PCP follow up as needed. Return precautions given.   Evaluation does not show pathology that would require ongoing emergent intervention or inpatient treatment. Patient is hemodynamically stable and mentating appropriately. Discussed findings and plan with patient who agrees with treatment plan as dictated.   Patient seen by and discussed with Dr. Patria Mane who agrees with treatment plan.    [facility] [place], PA-C 11/12/15 0530  Azalia Bilis, MD 11/12/15 [phone]

## 2015-11-12 NOTE — ED Notes (Signed)
PT reports waking up around 2am this morning with complaint of blood sugar being low and then began experiencing racing heart beat. Denies any history of A-fib but has extensive cardiac stent history. Denies any active chest pain at this time. Alert and oriented x 4 no acute distress.

## 2015-11-12 NOTE — ED Notes (Signed)
Pt reports understanding of discharge information. No questions at time of discharge 

## 2016-02-09 ENCOUNTER — Other Ambulatory Visit: Payer: Self-pay | Admitting: Internal Medicine

## 2016-02-09 DIAGNOSIS — F17200 Nicotine dependence, unspecified, uncomplicated: Secondary | ICD-10-CM

## 2016-05-16 ENCOUNTER — Ambulatory Visit
Admission: RE | Admit: 2016-05-16 | Discharge: 2016-05-16 | Disposition: A | Discharge status: Home / Self Care | Payer: Medicare Other | Source: Ambulatory Visit | Attending: Internal Medicine | Admitting: Internal Medicine

## 2016-05-16 DIAGNOSIS — F17200 Nicotine dependence, unspecified, uncomplicated: Secondary | ICD-10-CM

## 2016-05-28 ENCOUNTER — Telehealth: Payer: Self-pay | Admitting: Physician Assistant

## 2016-05-28 NOTE — Telephone Encounter (Signed)
Received records from Ralene Okoy Moreira MD for appointment on 06/04/16 with Austin Mclaughlin.  Records given to [name] (medical records) for [name]'s schedule on 06/04/16. lp

## 2016-06-04 ENCOUNTER — Encounter: Payer: Self-pay | Admitting: Physician Assistant

## 2016-06-04 ENCOUNTER — Ambulatory Visit (INDEPENDENT_AMBULATORY_CARE_PROVIDER_SITE_OTHER): Payer: Medicare Other | Admitting: Physician Assistant

## 2016-06-04 VITALS — BP 142/74 | HR 68 | Ht 64.0 in | Wt 173.4 lb

## 2016-06-04 DIAGNOSIS — I10 Essential (primary) hypertension: Secondary | ICD-10-CM | POA: Diagnosis not present

## 2016-06-04 DIAGNOSIS — I739 Peripheral vascular disease, unspecified: Secondary | ICD-10-CM | POA: Diagnosis not present

## 2016-06-04 DIAGNOSIS — E785 Hyperlipidemia, unspecified: Secondary | ICD-10-CM | POA: Diagnosis not present

## 2016-06-04 DIAGNOSIS — I2510 Atherosclerotic heart disease of native coronary artery without angina pectoris: Secondary | ICD-10-CM | POA: Diagnosis not present

## 2016-06-04 MED ORDER — ATORVASTATIN CALCIUM 80 MG PO TABS: 80.0000 mg | ORAL_TABLET | Freq: Every day | ORAL | 3 refills | Status: DC

## 2016-06-04 MED ORDER — METOPROLOL SUCCINATE ER 25 MG PO TB24: 25.0000 mg | ORAL_TABLET | Freq: Every day | ORAL | 3 refills | Status: DC

## 2016-06-04 NOTE — Progress Notes (Signed)
Cardiology Office Note   Date:  06/04/2016   ID:  Austin Mclaughlin, DOB [birth year], MRN [id number]  PCP:  Ralene OkMOREIRA,ROY, MD  Cardiologist:  Dr Allyson SabalBerry 09/2014 Theodore DemarkBarrett, Uriyah Raska, PA-C   Chief Complaint  Patient presents with  . Follow-up    x- ray Dr.Moreira     History of Present Illness: Austin Spitzvon Jacuinde is a 70 y.o. male with a history of PDA 100% & DES AV CFX & OM2 2008, STEMI 02/2013 w/ , nl MV 2016, GERD, HTN, HLD, DM, ongoing tobacco use, mild PAD w/ ABIs 2015 R 0.85 & L 0.71.  "patent LAD stent" mentioned in d/c summary after cath in 2014 and in subsequent office notes, but that was a typo. He does not have an LAD stent.  [facility] for evaluation of an abnormal chest CT.  He generally does very well. He works in [employer] and estimates he walks > 10 miles/day at work 5 days per week. He moves boxes as well. He never gets chest pain or SOB with exertion. He has no LE edema. His legs may ache at times, but that has improved since being started on Zontivity by Dr Ludwig ClarksMoreira at a recent office visit.  He is still smoking, but not very much. He does not cough. No recent symptoms of any type except MS aches and pains. His BP is up a little today, but he says it is normal most of the time (125/70 at Dr Jacqulyn BathMoreira's office).    Past Medical History:  Diagnosis Date  . Arthritis    "all over" (02/17/2013)  . Chronic lower back pain   . Coronary artery disease   . GERD (gastroesophageal reflux disease)   . High cholesterol   . Hypertension   . Myocardial infarction 2008   stents to AV CFX & OM  . Type II diabetes mellitus (HCC)     Past Surgical History:  Procedure Laterality Date  . CARDIAC CATHETERIZATION  02/15/2013  . CORONARY ANGIOPLASTY WITH STENT PLACEMENT  2008   DES AV CFX & OM2  . CORONARY ARTERY BYPASS GRAFT    . NM MYOVIEW LTD  2008   Abnl    Current Outpatient Prescriptions  Medication Sig Dispense Refill  . amLODipine-benazepril (LOTREL) 10-40 MG capsule Take 1  capsule by mouth daily.  3  . aspirin EC 81 MG EC tablet Take 1 tablet (81 mg total) by mouth daily.    . clopidogrel (PLAVIX) 75 MG tablet Take 75 mg by mouth daily.    Marland Kitchen. glimepiride (AMARYL) 2 MG tablet Take 2 mg by mouth daily.    . hydrochlorothiazide (HYDRODIURIL) 25 MG tablet Take 1 tablet by mouth daily.  5  . insulin glargine (LANTUS) 100 UNIT/ML injection Inject 36 Units into the skin daily.     . metFORMIN (GLUCOPHAGE) 500 MG tablet Take 500 mg by mouth 2 (two) times daily.  9  . nitroGLYCERIN (NITROSTAT) 0.4 MG SL tablet Place 1 tablet (0.4 mg total) under the tongue every 5 (five) minutes x 3 doses as needed for chest pain. 25 tablet 2  . pantoprazole (PROTONIX) 40 MG tablet Take 40 mg by mouth daily.    . simvastatin (ZOCOR) 80 MG tablet Take 1 tablet by mouth daily.  11   No current facility-administered medications for this visit.     Allergies:   Patient has no known allergies.    Social History:  The patient  reports that he quit smoking about 2 years ago.  His smoking use included Cigarettes. He has a 39.00 pack-year smoking history. He has never used smokeless tobacco. He reports that he drinks about 8.4 oz of alcohol per week . He reports that he does not use drugs.   Family History:  The patient's family history is not on file.    ROS:  Please see the history of present illness. All other systems are reviewed and negative.    PHYSICAL EXAM: VS:  BP (!) 142/74   Pulse 68   Ht 5\' 4"  (1.626 m)   Wt 173 lb 6.4 oz (78.7 kg)   BMI 29.76 kg/m  , BMI Body mass index is 29.76 kg/m. GEN: Well nourished, well developed, male in no acute distress  HEENT: normal for age  Neck: no JVD, no carotid bruit, no masses Cardiac: RRR; soft murmur, no rubs, or gallops Respiratory:  clear to auscultation bilaterally, normal work of breathing GI: soft, nontender, nondistended, + BS MS: no deformity or atrophy; no edema; distal pulses are 2+ in all 4 extremities   Skin: warm and  dry, no rash Neuro:  Strength and sensation are intact Psych: euthymic mood, full affect   EKG:  EKG is ordered today. The ekg ordered today demonstrates SR, HR 68. No acute ischemic changes, normal intervals.   MYOVIEW: 2016 QPS Raw Data Images:  Normal; no motion artifact; normal heart/lung ratio. Stress Images:  Normal homogeneous uptake in all areas of the myocardium. Rest Images:  Normal homogeneous uptake in all areas of the myocardium. Subtraction (SDS):  No evidence of ischemia.5 LV Wall Motion:  NL LV Function; NL Wall Motion; EF 59% Impression Exercise Capacity:  Good exercise capacity. BP Response:  Normal blood pressure response. Clinical Symptoms:  No significant symptoms noted. ECG Impression:  No significant ST segment change suggestive of ischemia. Comparison with Prior Nuclear Study: No significant change from previous study Overall Impression:  Normal stress nuclear study.  Ct Chest Lung Ca Screen Low Dose W/o Cm Result Date: 05/16/2016 CLINICAL DATA:  70 year old asymptomatic male current smoker with 30 pack-year smoking history. EXAM: CT CHEST WITHOUT CONTRAST LOW-DOSE FOR LUNG CANCER SCREENING TECHNIQUE: Multidetector CT imaging of the chest was performed following the standard protocol without IV contrast. COMPARISON:  07/24/2014 screening chest CT. FINDINGS: Cardiovascular: Normal heart size. No significant pericardial fluid/thickening. Left main, left anterior descending, left circumflex and right coronary atherosclerosis. Atherosclerotic nonaneurysmal thoracic aorta. Normal caliber pulmonary arteries. Mediastinum/Nodes: No discrete thyroid nodules. Unremarkable esophagus. No pathologically enlarged axillary, mediastinal or gross hilar lymph nodes, noting limited sensitivity for the detection of hilar adenopathy on this noncontrast study. Lungs/Pleura: No pneumothorax. No pleural effusion. Moderate centrilobular emphysema and diffuse bronchial wall thickening.  Lateral segment right middle lobe pulmonary nodule measuring 3.0 mm in volume derived mean diameter (series 3/ image 135) is stable. Stable anterior medial basilar right upper lobe pulmonary nodule measuring 1.1 mm in volume derived mean diameter (series 3/ image 127). No new significant pulmonary nodules. Upper abdomen: Unremarkable. Musculoskeletal: No aggressive appearing focal osseous lesions. Mild thoracic spondylosis.  IMPRESSION: 1. Lung-RADS Category 2S, benign appearance or behavior. Continue annual screening with low-dose chest CT without contrast in 12 months. 2. The "S" modifier above refers to potentially clinically significant non lung cancer related findings. Specifically, left main and 3 vessel coronary atherosclerosis. 3. Aortic atherosclerosis. 4. Moderate emphysema with diffuse bronchial wall thickening, suggesting COPD. Electronically Signed   By: Delbert PhenixJason A Poff M.D.   On: 05/16/2016 16:13    Recent Labs: 11/12/2015: BUN  17; Creatinine, Ser 0.73; Hemoglobin 13.2; Magnesium 1.4; Platelets 298; Potassium 2.9; Sodium 141    Lipid Panel    Component Value Date/Time   CHOL 185 02/16/2013 0245   TRIG 171 (H) 02/16/2013 0245   HDL 40 02/16/2013 0245   CHOLHDL 4.6 02/16/2013 0245   VLDL 34 02/16/2013 0245   LDLCALC 111 (H) 02/16/2013 0245     Wt Readings from Last 3 Encounters:  06/04/16 173 lb 6.4 oz (78.7 kg)  09/26/14 180 lb 12.8 oz (82 kg)  08/23/14 184 lb (83.5 kg)     Other studies Reviewed: Additional studies/ records that were reviewed today include: Dr Jacqulyn Bath office notes, CT report, previous office notes and other records.  ASSESSMENT AND PLAN:  1.  Coronary and aortic atherosclerosis: by symptoms, he is doing very well. Pursue aggressive CRF control. Follow. Pt encouraged to report sx.   Discuss w/ MD if pt need [place] of aorta or CT w/ contrast to further eval.  2. CAD: No ischemic sx and MV a year ago was nl, continue ASA, Plavix, continue statin with changes. No  longer on metoprolol for unclear reasons (was on at appt w/ Dr Ludwig Clarks on 11/03) but BP/HR are well-controlled. Will discuss with patient.   3. Hyperlipidemia: pt was on Lipitor 40 mg, now on Zocor 80 mg. However, goal for LDL is 70 mg and he is above that. Will change him back to Lipitor and go up to 80 mg qd. F/u with cards or Dr Ludwig Clarks for lipids and liver in 3 months.  4. Tobacco use: cessation discussed and encouraged.  5. DM: Tight glucose control encouraged. He does not know his A1c, he needs to be more aggressive with dietary management. F/u with Dr Ludwig Clarks and/or Endocrinology  6. PAD: Sx improved after Zontivity started. ABIs last performed 2015. Repeat per MD.   Plan: pt encouraged to be more aggressive about cardiac risk management on several fronts. He is also encouraged to keep his activity level good as this is helping him in multiple ways.  Current medicines are reviewed at length with the patient today.  The patient does not have concerns regarding medicines.  The following changes have been made:  Change Zocor to Lipitor  Labs/ tests ordered today include:  No orders of the defined types were placed in this encounter.    Disposition:   FU with Dr Allyson Sabal  Signed, Theodore Demark, PA-C  06/04/2016 10:28 AM    [facility] Phone: [phone]; Fax: [phone]  This note was written with the assistance of speech recognition software. Please excuse any transcriptional errors.

## 2016-06-04 NOTE — Patient Instructions (Signed)
Medication Instructions:   STOP SIMVASTATIN  START ATORVASTATIN 80 MG ONCE DAILY  Follow-Up:  Your physician recommends that you schedule a follow-up appointment in: 3 MONTHS WITH DR BERRY   STOP SMOKING  Diabetes Mellitus and Food It is important for you to manage your blood sugar (glucose) level. Your blood glucose level can be greatly affected by what you eat. Eating healthier foods in the appropriate amounts throughout the day at about the same time each day will help you control your blood glucose level. It can also help slow or prevent worsening of your diabetes mellitus. Healthy eating may even help you improve the level of your blood pressure and reach or maintain a healthy weight. General recommendations for healthful eating and cooking habits include:  Eating meals and snacks regularly. Avoid going long periods of time without eating to lose weight.  Eating a diet that consists mainly of plant-based foods, such as fruits, vegetables, nuts, legumes, and whole grains.  Using low-heat cooking methods, such as baking, instead of high-heat cooking methods, such as deep frying. Work with your dietitian to make sure you understand how to use the Nutrition Facts information on food labels. How can food affect me? Carbohydrates  Carbohydrates affect your blood glucose level more than any other type of food. Your dietitian will help you determine how many carbohydrates to eat at each meal and teach you how to count carbohydrates. Counting carbohydrates is important to keep your blood glucose at a healthy level, especially if you are using insulin or taking certain medicines for diabetes mellitus. Alcohol  Alcohol can cause sudden decreases in blood glucose (hypoglycemia), especially if you use insulin or take certain medicines for diabetes mellitus. Hypoglycemia can be a life-threatening condition. Symptoms of hypoglycemia (sleepiness, dizziness, and disorientation) are similar to symptoms  of having too much alcohol. If your health care provider has given you approval to drink alcohol, do so in moderation and use the following guidelines:  Women should not have more than one drink per day, and men should not have more than two drinks per day. One drink is equal to:  12 oz of beer.  5 oz of wine.  1 oz of hard liquor.  Do not drink on an empty stomach.  Keep yourself hydrated. Have water, diet soda, or unsweetened iced tea.  Regular soda, juice, and other mixers might contain a lot of carbohydrates and should be counted. What foods are not recommended? As you make food choices, it is important to remember that all foods are not the same. Some foods have fewer nutrients per serving than other foods, even though they might have the same number of calories or carbohydrates. It is difficult to get your body what it needs when you eat foods with fewer nutrients. Examples of foods that you should avoid that are high in calories and carbohydrates but low in nutrients include:  Trans fats (most processed foods list trans fats on the Nutrition Facts label).  Regular soda.  Juice.  Candy.  Sweets, such as cake, pie, doughnuts, and cookies.  Fried foods. What foods can I eat? Eat nutrient-rich foods, which will nourish your body and keep you healthy. The food you should eat also will depend on several factors, including:  The calories you need.  The medicines you take.  Your weight.  Your blood glucose level.  Your blood pressure level.  Your cholesterol level. You should eat a variety of foods, including:  Protein.  Lean cuts of meat.  Proteins low in saturated fats, such as fish, egg whites, and beans. Avoid processed meats.  Fruits and vegetables.  Fruits and vegetables that may help control blood glucose levels, such as apples, mangoes, and yams.  Dairy products.  Choose fat-free or low-fat dairy products, such as milk, yogurt, and cheese.  Grains,  bread, pasta, and rice.  Choose whole grain products, such as multigrain bread, whole oats, and brown rice. These foods may help control blood pressure.  Fats.  Foods containing healthful fats, such as nuts, avocado, olive oil, canola oil, and fish. Does everyone with diabetes mellitus have the same meal plan? Because every person with diabetes mellitus is different, there is not one meal plan that works for everyone. It is very important that you meet with a dietitian who will help you create a meal plan that is just right for you. This information is not intended to replace advice given to you by your health care provider. Make sure you discuss any questions you have with your health care provider. Document Released: 03/20/2005 Document Revised: 11/29/2015 Document Reviewed: 05/20/2013 Elsevier Interactive Patient Education  2017 [employer]

## 2016-06-04 NOTE — Addendum Note (Signed)
Addended by: Freddi StarrMATHIS, Alicia Seib W on: 06/04/2016 04:59 PM   Modules accepted: Orders

## 2016-08-22 ENCOUNTER — Other Ambulatory Visit: Payer: Self-pay | Admitting: Internal Medicine

## 2016-08-22 DIAGNOSIS — R7989 Other specified abnormal findings of blood chemistry: Secondary | ICD-10-CM

## 2016-08-22 DIAGNOSIS — R945 Abnormal results of liver function studies: Secondary | ICD-10-CM

## 2016-08-29 ENCOUNTER — Ambulatory Visit
Admission: RE | Admit: 2016-08-29 | Discharge: 2016-08-29 | Disposition: A | Discharge status: Home / Self Care | Payer: Medicare Other | Source: Ambulatory Visit | Attending: Internal Medicine | Admitting: Internal Medicine

## 2016-08-29 DIAGNOSIS — R7989 Other specified abnormal findings of blood chemistry: Secondary | ICD-10-CM

## 2016-08-29 DIAGNOSIS — R945 Abnormal results of liver function studies: Secondary | ICD-10-CM

## 2016-09-03 ENCOUNTER — Encounter: Payer: Self-pay | Admitting: Cardiovascular Disease

## 2016-09-03 ENCOUNTER — Ambulatory Visit (INDEPENDENT_AMBULATORY_CARE_PROVIDER_SITE_OTHER): Payer: Medicare Other | Admitting: Cardiovascular Disease

## 2016-09-03 DIAGNOSIS — E7800 Pure hypercholesterolemia, unspecified: Secondary | ICD-10-CM | POA: Diagnosis not present

## 2016-09-03 DIAGNOSIS — Z720 Tobacco use: Secondary | ICD-10-CM | POA: Diagnosis not present

## 2016-09-03 DIAGNOSIS — I10 Essential (primary) hypertension: Secondary | ICD-10-CM | POA: Diagnosis not present

## 2016-09-03 DIAGNOSIS — I2510 Atherosclerotic heart disease of native coronary artery without angina pectoris: Secondary | ICD-10-CM | POA: Diagnosis not present

## 2016-09-03 NOTE — Assessment & Plan Note (Signed)
History of hypertension and blood pressure measured at 142/60. He is on amlodipine, benazapril, hydrochlorothiazide and metoprolol. Continue current meds at current dosing

## 2016-09-03 NOTE — Assessment & Plan Note (Signed)
Recently discontinued tobacco abuse 3 months ago.

## 2016-09-03 NOTE — Assessment & Plan Note (Signed)
History of hyperlipidemia on statin therapy followed by his PCP 

## 2016-09-03 NOTE — Patient Instructions (Signed)

## 2016-09-03 NOTE — Progress Notes (Signed)
09/03/2016 Austin Spitzvon Scifres   05/17/1946  [id number]  Primary Physician Ralene OkMOREIRA,ROY, MD Primary Cardiologist: Runell GessJonathan J Gabriele Zwilling MD Roseanne RenoFACP, FACC, FAHA, FSCAI  HPI:  [name] is a 71 year old Married African American male, father of 4 who I last saw in the office 09/26/14., He has a history of CAD, s/p cath in 2008 after a stress test for CP showed ischemia. The cath, at that time, demonstrated an occluded PDA and collaterals with high grade OM2 disease and proximal segmental AV groove left circ disease. He underwent PCI of the AV groove left circ and OM2 with DES. He had reportedly done well until developing chest pain the day prior to arrival to the ED. The CP had been intermittent for over the course of 24 hours, before recurring and prompting the patient to seek evaluation. He described the pain as a sharp, stinging pain in his left chest with no SOB, nausea or diaphoresis. [facility] he was noted to have 4mm of ST elevation in V1 and V2 and a STEMI was called and [facility]. [facility], his chest pain resolved. He was taken urgently to the cath lab. The procedure was performed by Dr. Allyson SabalBerry via the right femoral artery. It demonstrated a patent LAD stent and diffuse RCA disease. Medical therapy was recommended. He left the cath lab in stable condition. Post-cath, he was noted to have moderate hypertension. He was placed on Lopressor, HCTZ and lisinopril. He had mild improvement in BP. The patient [facility] the Monday after discharge and his lisinopril was up titrated. Since that time he's had no chest pain and for some reason no longer is on an ACE inhibitor. He also has stopped smoking several months ago after smoking one pack per day for 55 years. He saw Nada BoozerLaura Ingold in the office 08/10/14. A Myoview stress test performed 2 weeks later was entirely normal. He remains asymptomatic. Since I saw him 2 years ago he's remained  asymptomatic.   Current Outpatient Prescriptions  Medication Sig Dispense Refill  . amLODipine-benazepril (LOTREL) 10-40 MG capsule Take 1 capsule by mouth daily.  3  . aspirin EC 81 MG EC tablet Take 1 tablet (81 mg total) by mouth daily.    . clopidogrel (PLAVIX) 75 MG tablet Take 75 mg by mouth daily.    Marland Kitchen. glimepiride (AMARYL) 2 MG tablet Take 2 mg by mouth daily.    . hydrochlorothiazide (HYDRODIURIL) 25 MG tablet Take 1 tablet by mouth daily.  5  . insulin glargine (LANTUS) 100 UNIT/ML injection Inject 36 Units into the skin daily.     . magnesium oxide (MAG-OX) 400 MG tablet Take 400 mg by mouth daily.    . metFORMIN (GLUCOPHAGE) 500 MG tablet Take 500 mg by mouth 2 (two) times daily.  9  . metoprolol succinate (TOPROL XL) 25 MG 24 hr tablet Take 1 tablet (25 mg total) by mouth daily. 90 tablet 3  . nitroGLYCERIN (NITROSTAT) 0.4 MG SL tablet Place 1 tablet (0.4 mg total) under the tongue every 5 (five) minutes x 3 doses as needed for chest pain. 25 tablet 2  . pantoprazole (PROTONIX) 40 MG tablet Take 40 mg by mouth daily.    . potassium chloride (K-DUR) 10 MEQ tablet Take 10 mEq by mouth daily.  3  . Vorapaxar Sulfate 2.08 MG TABS Take 1 tablet by mouth daily. 30 tablet   . atorvastatin (LIPITOR)  80 MG tablet Take 1 tablet (80 mg total) by mouth daily. 90 tablet 3   No current facility-administered medications for this visit.     No Known Allergies  Social History   Social History  . Marital status: Married    Spouse name: [name]  . Number of children: [name]  . Years of education: [name]   Occupational History  . [employer]     Delivers meds to hospitals and Pharmacies   Social History Main Topics  . Smoking status: Former Smoker    Packs/day: 1.00    Years: 39.00    Types: Cigarettes    Quit date: 04/17/2014  . Smokeless tobacco: Never Used     Comment: every once in a while  . Alcohol use 8.4 oz/week    12 Cans of beer, 2 Shots of liquor per week      Comment: 02/17/2013 "6 beers/weekend day"  . Drug use: No  . Sexual activity: Yes   Other Topics Concern  . Not on file   Social History Narrative  . No narrative on file     Review of Systems: General: negative for chills, fever, night sweats or weight changes.  Cardiovascular: negative for chest pain, dyspnea on exertion, edema, orthopnea, palpitations, paroxysmal nocturnal dyspnea or shortness of breath Dermatological: negative for rash Respiratory: negative for cough or wheezing Urologic: negative for hematuria Abdominal: negative for nausea, vomiting, diarrhea, bright red blood per rectum, melena, or hematemesis Neurologic: negative for visual changes, syncope, or dizziness All other systems reviewed and are otherwise negative except as noted above.    Blood pressure (!) 142/60, pulse 76, height 5\' 3"  (1.6 m), weight 173 lb (78.5 kg).  General appearance: alert and no distress Neck: no adenopathy, no carotid bruit, no JVD, supple, symmetrical, trachea midline and thyroid not enlarged, symmetric, no tenderness/mass/nodules Lungs: clear to auscultation bilaterally Heart: regular rate and rhythm, S1, S2 normal, no murmur, click, rub or gallop Extremities: extremities normal, atraumatic, no cyanosis or edema  EKG not performed today  ASSESSMENT AND PLAN:   CAD (coronary artery disease) History of CAD status post cardiac catheterization 2008 after stress test for chest pain was remarkable for ischemia. Cath revealed an occluded PDA with collaterals as well as high-grade OM 2 disease and proximal segmental AV groove circumflex disease which underwent PCI and drug-eluting stenting. He did well until a subsequent catheterization revealed stable anatomy. He did have a nonischemic Myoview stress test performed 08/26/14. He denies chest pain or shortness of breath.  Tobacco abuse Recently discontinued tobacco abuse 3 months ago.  HTN (hypertension) History of hypertension and blood  pressure measured at 142/60. He is on amlodipine, benazapril, hydrochlorothiazide and metoprolol. Continue current meds at current dosing  Hyperlipidemia History of hyperlipidemia on statin therapy followed by his PCP      [name] MD [facility], [facility] 09/03/2016 11:23 AM

## 2016-09-03 NOTE — Assessment & Plan Note (Signed)
History of CAD status post cardiac catheterization 2008 after stress test for chest pain was remarkable for ischemia. Cath revealed an occluded PDA with collaterals as well as high-grade OM 2 disease and proximal segmental AV groove circumflex disease which underwent PCI and drug-eluting stenting. He did well until a subsequent catheterization revealed stable anatomy. He did have a nonischemic Myoview stress test performed 08/26/14. He denies chest pain or shortness of breath.

## 2016-09-16 ENCOUNTER — Telehealth: Payer: Self-pay | Admitting: Cardiovascular Disease

## 2016-09-16 NOTE — Telephone Encounter (Signed)
Requesting surgical clearance:  1. Type of surgery: Endoscopy  2. Surgeon: Dr. Jordan Hawks. Hung  3.Surgical Date:09/25/16  4. Medications that need to be held: ASA 81 and Plavix--7 days prior   5. CAD: Yes  6. I will defer to:  Dr. Conception Oms Information:  [facility] Phone: [phone] Fax:  [phone]

## 2016-09-17 NOTE — Telephone Encounter (Signed)
Okay to interrupt antiplatelet therapy for endoscopy 

## 2016-09-17 NOTE — Telephone Encounter (Signed)
Clearance routed to number provided via EPIC. 

## 2016-09-18 ENCOUNTER — Telehealth: Payer: Self-pay | Admitting: Cardiovascular Disease

## 2016-09-18 NOTE — Telephone Encounter (Signed)
New message      They needs medical clearance for the pt to have anesthesia you sent clearance for stopping medication but not ok for anesthesia   Fax [phone] needs permission before 09/23/16

## 2016-09-18 NOTE — Telephone Encounter (Signed)
Forward to Dr  Allyson SabalBERRY  per last message, patient was cleared to hold medication for 7 days prior to endoscopy.    need a clearance for the anesthesia for endoscopy

## 2016-09-19 NOTE — Telephone Encounter (Signed)
Pt can have conscious sedation for endo

## 2016-09-19 NOTE — Telephone Encounter (Signed)
LEFT DETAIL MESSAGE INFORMATION ROUTE TO GIVEN FAX NUMBER

## 2016-10-07 ENCOUNTER — Emergency Department (HOSPITAL_COMMUNITY): Payer: Medicare Other

## 2016-10-07 ENCOUNTER — Emergency Department (HOSPITAL_COMMUNITY)
Admission: EM | Admit: 2016-10-07 | Discharge: 2016-10-07 | Disposition: A | Discharge status: Home / Self Care | Payer: Medicare Other | Attending: Emergency Medicine | Admitting: Emergency Medicine

## 2016-10-07 DIAGNOSIS — R252 Cramp and spasm: Secondary | ICD-10-CM

## 2016-10-07 DIAGNOSIS — I2510 Atherosclerotic heart disease of native coronary artery without angina pectoris: Secondary | ICD-10-CM | POA: Insufficient documentation

## 2016-10-07 DIAGNOSIS — M79641 Pain in right hand: Secondary | ICD-10-CM | POA: Insufficient documentation

## 2016-10-07 DIAGNOSIS — E119 Type 2 diabetes mellitus without complications: Secondary | ICD-10-CM | POA: Diagnosis not present

## 2016-10-07 DIAGNOSIS — E876 Hypokalemia: Secondary | ICD-10-CM | POA: Diagnosis not present

## 2016-10-07 DIAGNOSIS — Z79899 Other long term (current) drug therapy: Secondary | ICD-10-CM | POA: Diagnosis not present

## 2016-10-07 DIAGNOSIS — Z87891 Personal history of nicotine dependence: Secondary | ICD-10-CM | POA: Diagnosis not present

## 2016-10-07 DIAGNOSIS — Z955 Presence of coronary angioplasty implant and graft: Secondary | ICD-10-CM | POA: Insufficient documentation

## 2016-10-07 DIAGNOSIS — I252 Old myocardial infarction: Secondary | ICD-10-CM | POA: Diagnosis not present

## 2016-10-07 DIAGNOSIS — Z7982 Long term (current) use of aspirin: Secondary | ICD-10-CM | POA: Diagnosis not present

## 2016-10-07 DIAGNOSIS — Z794 Long term (current) use of insulin: Secondary | ICD-10-CM | POA: Insufficient documentation

## 2016-10-07 LAB — I-STAT TROPONIN, ED
Troponin i, poc: 0 ng/mL (ref 0.00–0.08)
Troponin i, poc: 0.01 ng/mL (ref 0.00–0.08)

## 2016-10-07 LAB — BASIC METABOLIC PANEL
Anion gap: 14 (ref 5–15)
BUN: 15 mg/dL (ref 6–20)
CALCIUM: 10 mg/dL (ref 8.9–10.3)
CHLORIDE: 102 mmol/L (ref 101–111)
CO2: 27 mmol/L (ref 22–32)
Creatinine, Ser: 1.07 mg/dL (ref 0.61–1.24)
GFR calc non Af Amer: >60 mL/min (ref >60)
Glucose, Bld: 73 mg/dL (ref 65–99)
Potassium: 2.8 mmol/L — ABNORMAL LOW (ref 3.5–5.1)
Sodium: 143 mmol/L (ref 135–145)

## 2016-10-07 LAB — I-STAT CHEM 8, ED
BUN: 17 mg/dL (ref 6–20)
CALCIUM ION: 1.24 mmol/L (ref 1.15–1.40)
CHLORIDE: 102 mmol/L (ref 101–111)
Creatinine, Ser: 1 mg/dL (ref 0.61–1.24)
GLUCOSE: 76 mg/dL (ref 65–99)
HCT: 37 % — ABNORMAL LOW (ref 39.0–52.0)
Hemoglobin: 12.6 g/dL — ABNORMAL LOW (ref 13.0–17.0)
Potassium: 2.7 mmol/L — CL (ref 3.5–5.1)
Sodium: 144 mmol/L (ref 135–145)
TCO2: 27 mmol/L (ref 0–100)

## 2016-10-07 LAB — MAGNESIUM: MAGNESIUM: 1.3 mg/dL — AB (ref 1.7–2.4)

## 2016-10-07 LAB — CBC
HEMATOCRIT: 36.7 % — AB (ref 39.0–52.0)
Hemoglobin: 12.7 g/dL — ABNORMAL LOW (ref 13.0–17.0)
MCH: 31.1 pg (ref 26.0–34.0)
MCHC: 34.6 g/dL (ref 30.0–36.0)
MCV: 89.7 fL (ref 78.0–100.0)
Platelets: 320 10*3/uL (ref 150–400)
RBC: 4.09 MIL/uL — ABNORMAL LOW (ref 4.22–5.81)
RDW: 12.4 % (ref 11.5–15.5)
WBC: 8.4 10*3/uL (ref 4.0–10.5)

## 2016-10-07 LAB — CK: Total CK: 359 U/L (ref 49–397)

## 2016-10-07 MED ORDER — POTASSIUM CHLORIDE CRYS ER 20 MEQ PO TBCR
40.0000 meq | EXTENDED_RELEASE_TABLET | Freq: Once | ORAL | Status: AC
  Administered 2016-10-07: 40 meq via ORAL
  Filled 2016-10-07: qty 2

## 2016-10-07 MED ORDER — POTASSIUM CHLORIDE CRYS ER 20 MEQ PO TBCR: 40.0000 meq | EXTENDED_RELEASE_TABLET | Freq: Two times a day (BID) | ORAL | 0 refills | Status: DC

## 2016-10-07 MED ORDER — POTASSIUM CHLORIDE 2 MEQ/ML IV SOLN
30.0000 meq | Freq: Once | INTRAVENOUS | Status: AC
  Administered 2016-10-07: 30 meq via INTRAVENOUS
  Filled 2016-10-07: qty 15

## 2016-10-07 MED ORDER — ASPIRIN 81 MG PO CHEW
324.0000 mg | CHEWABLE_TABLET | Freq: Once | ORAL | Status: AC
  Administered 2016-10-07: 324 mg via ORAL
  Filled 2016-10-07: qty 4

## 2016-10-07 MED ORDER — MAGNESIUM OXIDE 400 (241.3 MG) MG PO TABS: 400.0000 mg | ORAL_TABLET | Freq: Two times a day (BID) | ORAL | 0 refills | Status: AC

## 2016-10-07 MED ORDER — NITROGLYCERIN 0.4 MG SL SUBL: 0.4000 mg | SUBLINGUAL_TABLET | SUBLINGUAL | Status: DC | PRN

## 2016-10-07 MED ORDER — MAGNESIUM OXIDE 400 (241.3 MG) MG PO TABS
400.0000 mg | ORAL_TABLET | Freq: Once | ORAL | Status: AC
  Administered 2016-10-07: 400 mg via ORAL
  Filled 2016-10-07: qty 1

## 2016-10-07 MED ORDER — MAGNESIUM SULFATE 2 GM/50ML IV SOLN
2.0000 g | Freq: Once | INTRAVENOUS | Status: AC
  Administered 2016-10-07: 2 g via INTRAVENOUS
  Filled 2016-10-07: qty 50

## 2016-10-07 NOTE — ED Notes (Addendum)
Name: Austin Mclaughlin is a 71 y.o. male Admit date: 10/07/2016 Referring Physician:  Leta Jungling MD Primary Physician:  Ralene Ok, M.D. Primary Cardiologist:  Nanetta Batty, M.D.  Reason for ED Evaluation:  Cramping in hands, nausea, and diaphoresis  ASSESSMENT: 1. Severe hypokalemia likely related to diuretic therapy. May need in-hospital repletion before discharge. 2. Prior history of coronary artery disease with circumflex stent non-Q wave myocardial infarction 2008 3. Essential hypertension 4. Type 2 diabetes mellitus  PLAN: 1. Hand and thigh cramping symptoms are related to hypokalemia. This should be aggressively repleted. Probably needs to be on maintenance potassium if he has to remain on hydrochlorothiazide 25 mg per day as OP. 2. EKGs and laboratory data have been reviewed. I do not believe this presentation represents acute coronary syndrome. 3. Cycle cardiac markers with a delta troponin I. 4. Please let us know if there are other questions.   HPI: 71 year old with a history of coronary disease with prior circumflex stent in 2008 who presents with migratory cramping and initially his hands and subsequently in his legs. It was very painful. Caused him to have diaphoresis and some associated nausea. He came to the emergency room because he was fearful he was having a heart attack. In the emergency room the EKGs did not reveal acute ST-T wave changes. Point-of-care troponin markers are unremarkable. Severe hypokalemia is noted on laboratory testing and confirmed by a repeat study.  PMH:   Past Medical History:  Diagnosis Date  . Arthritis    "all over" (02/17/2013)  . Chronic lower back pain   . Coronary artery disease   . GERD (gastroesophageal reflux disease)   . High cholesterol   . Hypertension   . Myocardial infarction 2008   stents to AV CFX & OM  . Type II diabetes mellitus (HCC)     PSH:   Past Surgical History:  Procedure Laterality Date  . CARDIAC  CATHETERIZATION  02/15/2013  . CORONARY ANGIOPLASTY WITH STENT PLACEMENT  2008   DES AV CFX & OM2  . NM MYOVIEW LTD  2016   Normal   Allergies:  Patient has no known allergies. Prior to Admit Meds:   (Not in a hospital admission) Fam HX:   No family history on file. Social HX:    Social History   Social History  . Marital status: Married    Spouse name: [name]  . Number of children: [name]  . Years of education: [name]   Occupational History  . [employer]     Delivers meds to hospitals and Pharmacies   Social History Main Topics  . Smoking status: Former Smoker    Packs/day: 1.00    Years: 39.00    Types: Cigarettes    Quit date: 04/17/2014  . Smokeless tobacco: Never Used     Comment: every once in a while  . Alcohol use 8.4 oz/week    12 Cans of beer, 2 Shots of liquor per week     Comment: 02/17/2013 "6 beers/weekend day"  . Drug use: No  . Sexual activity: Yes   Other Topics Concern  . Not on file   Social History Narrative  . No narrative on file     Review of Systems: Denies dyspnea and chest pain. Coronary angioma in 2014 demonstrated patent stents with a 60% obtuse marginal stenosis. He denies orthopnea and PND. No lower extremity swelling. Normal stress nuclear study in February 2016.. All other systems are negative.  Physical Exam: Blood  pressure 140/62, pulse 71, resp. rate 18, SpO2 99 %. Weight change:   Patient is comfortable currently. Cramping has resolved. Neck veins are flat. No carotid bruits are heard. Chest is clear to auscultation and percussion. Cardiac exam reveals no S4 gallop but is otherwise unremarkable. Abdomen is soft. Extremities reveal no edema. Radial and pedal pulses are 2+. Neurological exam is unremarkable.  Labs: Lab Results  Component Value Date   WBC 8.4 10/07/2016   HGB 12.6 (L) 10/07/2016   HCT 37.0 (L) 10/07/2016   MCV 89.7 10/07/2016   PLT 320 10/07/2016    Recent Labs Lab 10/07/16 1600 10/07/16 1613  NA  143 144  K 2.8* 2.7*  CL 102 102  CO2 27  --   BUN 15 17  CREATININE 1.07 1.00  CALCIUM 10.0  --   GLUCOSE 73 76   No results found for: PTT Lab Results  Component Value Date   INR 0.97 02/16/2013   Lab Results  Component Value Date   CKTOTAL 359 10/07/2016   CKMB 3.1 12/25/2006   TROPONINI <0.03 11/12/2015     Lab Results  Component Value Date   CHOL 185 02/16/2013   Lab Results  Component Value Date   HDL 40 02/16/2013   Lab Results  Component Value Date   LDLCALC 111 (H) 02/16/2013   Lab Results  Component Value Date   TRIG 171 (H) 02/16/2013   Lab Results  Component Value Date   CHOLHDL 4.6 02/16/2013   No results found for: LDLDIRECT    Radiology:  Dg Chest Portable 1 View  Result Date: 10/07/2016 CLINICAL DATA:  Chest pain.  Vomiting. EXAM: PORTABLE CHEST 1 VIEW COMPARISON:  11/12/2015 FINDINGS: Heart size and pulmonary vascularity are normal and the lungs are clear. No effusions. No acute bone abnormality. Calcification in the arch of the aorta. IMPRESSION: No acute abnormalities. Aortic atherosclerosis. Electronically Signed   By: Francene Boyers M.D.   On: 10/07/2016 16:07    EKG:  ECG performed on 10/07/16 at 1530 demonstrates poor R-wave progression V1 through V3, sinus rhythm, and occasional PVCs. No acute ST-T wave changes noted.    Lyn Records III 10/07/2016 5:51 PM

## 2016-10-07 NOTE — ED Provider Notes (Signed)
[facility]-EMERGENCY DEPT Provider Note   CSN: [id number] Arrival date & time: 10/07/16  1515     History   Chief Complaint Chief Complaint  Patient presents with  . Excessive Sweating  . Muscle Pain  . Back Pain    HPI Austin Mclaughlin is a 71 y.o. male.  HPI 71 year old male with past medical history of MI, diabetes, coronary artery disease, who presents with generalized fatigue. The patient states that he was in his usual state of health today until he began feeling a cramping sensation in his right hand and left hand. He then developed aching sensation in his bilateral thighs. He had associated diaphoresis as well as nausea. Denies any chest pain but he states he had similar symptoms with his heart attack in the past. Currently, he endorses persistent cramping in his legs but denies any ongoing diaphoresis. He did have some mild nausea but no vomiting. Denies any fevers or chills. He is been taking his medications as prescribed.  Past Medical History:  Diagnosis Date  . Arthritis    "all over" (02/17/2013)  . Chronic lower back pain   . Coronary artery disease   . GERD (gastroesophageal reflux disease)   . High cholesterol   . Hypertension   . Myocardial infarction 2008   stents to AV CFX & OM  . Type II diabetes mellitus [facility])     Patient Active Problem List   Diagnosis Date Noted  . Hypokalemia   . Hyperlipidemia 04/26/2014  . CAD (coronary artery disease) 02/16/2013  . Chest pain 02/16/2013  . Tobacco abuse 02/16/2013  . HTN (hypertension) 02/16/2013  . DM type 2 (diabetes mellitus, type 2) (HCC) 02/16/2013    Past Surgical History:  Procedure Laterality Date  . CARDIAC CATHETERIZATION  02/15/2013  . CORONARY ANGIOPLASTY WITH STENT PLACEMENT  2008   DES AV CFX & OM2  . NM MYOVIEW LTD  2016   Normal       Home Medications    Prior to Admission medications   Medication Sig Start Date End Date Taking? Authorizing Provider  amLODipine-benazepril (LOTREL) 10-40 MG  capsule Take 1 capsule by mouth daily. 09/24/16  Yes Historical Provider, MD  aspirin EC 81 MG EC tablet Take 1 tablet (81 mg total) by mouth daily. 02/17/13  Yes Brittainy Sherlynn Carbon, PA-C  atorvastatin (LIPITOR) 80 MG tablet Take 1 tablet (80 mg total) by mouth daily. 06/04/16 10/07/16 Yes [name] G Barrett, PA-C  clopidogrel (PLAVIX) 75 MG tablet Take 75 mg by mouth daily.   Yes Historical Provider, MD  glimepiride (AMARYL) 2 MG tablet Take 2 mg by mouth daily.   Yes Historical Provider, MD  hydrochlorothiazide (HYDRODIURIL) 25 MG tablet Take 1 tablet by mouth daily. 11/05/15  Yes Historical Provider, MD  insulin glargine (LANTUS) 100 UNIT/ML injection Inject 36 Units into the skin every morning.    Yes Historical Provider, MD  magnesium oxide (MAG-OX) 400 MG tablet Take 400 mg by mouth daily.   Yes Historical Provider, MD  metFORMIN (GLUCOPHAGE) 500 MG tablet Take 500 mg by mouth 2 (two) times daily. 11/07/15  Yes Historical Provider, MD  metoprolol succinate (TOPROL XL) 25 MG 24 hr tablet Take 1 tablet (25 mg total) by mouth daily. 06/04/16  Yes [name] G Barrett, PA-C  nitroGLYCERIN (NITROSTAT) 0.4 MG SL tablet Place 1 tablet (0.4 mg total) under the tongue every 5 (five) minutes x 3 doses as needed for chest pain. 02/17/13  Yes Brittainy Sherlynn Carbon, PA-C  pantoprazole (PROTONIX)  40 MG tablet Take 40 mg by mouth daily.   Yes Historical Provider, MD  potassium chloride (K-DUR) 10 MEQ tablet Take 10 mEq by mouth daily. 09/01/16  Yes Historical Provider, MD  Vorapaxar Sulfate 2.08 MG TABS Take 1 tablet by mouth daily. 06/04/16  Yes [name] G Barrett, PA-C  magnesium oxide (MAG-OX) 400 (241.3 Mg) MG tablet Take 1 tablet (400 mg total) by mouth 2 (two) times daily. 10/07/16 10/10/16  Shaune Pollack, MD  potassium chloride SA (K-DUR,KLOR-CON) 20 MEQ tablet Take 2 tablets (40 mEq total) by mouth 2 (two) times daily. 10/07/16 10/10/16  Shaune Pollack, MD    Family History No family history on file.  Social  History Social History  Substance Use Topics  . Smoking status: Former Smoker    Packs/day: 1.00    Years: 39.00    Types: Cigarettes    Quit date: 04/17/2014  . Smokeless tobacco: Never Used     Comment: every once in a while  . Alcohol use 8.4 oz/week    12 Cans of beer, 2 Shots of liquor per week     Comment: 02/17/2013 "6 beers/weekend day"     Allergies   Patient has no known allergies.   Review of Systems Review of Systems  Constitutional: Positive for fatigue. Negative for chills and fever.  HENT: Negative for congestion and rhinorrhea.   Eyes: Negative for visual disturbance.  Respiratory: Negative for cough, shortness of breath and wheezing.   Cardiovascular: Negative for chest pain and leg swelling.  Gastrointestinal: Negative for abdominal pain, diarrhea, nausea and vomiting.  Genitourinary: Negative for dysuria and flank pain.  Musculoskeletal: Positive for arthralgias and myalgias. Negative for neck pain and neck stiffness.  Skin: Negative for rash and wound.  Allergic/Immunologic: Negative for immunocompromised state.  Neurological: Negative for syncope, weakness and headaches.  All other systems reviewed and are negative.    Physical Exam Updated Vital Signs BP 136/74   Pulse 64   Resp 16   SpO2 96%   Physical Exam  Constitutional: He is oriented to person, place, and time. He appears well-developed and well-nourished. No distress.  HENT:  Head: Normocephalic and atraumatic.  Eyes: Conjunctivae are normal.  Neck: Neck supple.  Cardiovascular: Normal rate, regular rhythm and normal heart sounds.  Exam reveals no friction rub.   No murmur heard. Pulmonary/Chest: Effort normal and breath sounds normal. No respiratory distress. He has no wheezes. He has no rales.  Abdominal: Soft. Bowel sounds are normal. He exhibits no distension.  Musculoskeletal: He exhibits no edema or tenderness (No muscular tenderness over upper or lower extremities bilaterally.  No erythema or warmth.).  Neurological: He is alert and oriented to person, place, and time. He exhibits normal muscle tone.  Skin: Skin is warm. Capillary refill takes less than 2 seconds.  Psychiatric: He has a normal mood and affect.  Nursing note and vitals reviewed.    ED Treatments / Results  Labs (all labs ordered are listed, but only abnormal results are displayed) Labs Reviewed  CBC - Abnormal; Notable for the following:       Result Value   RBC 4.09 (*)    Hemoglobin 12.7 (*)    HCT 36.7 (*)    All other components within normal limits  BASIC METABOLIC PANEL - Abnormal; Notable for the following:    Potassium 2.8 (*)    All other components within normal limits  MAGNESIUM - Abnormal; Notable for the following:    Magnesium 1.3 (*)  All other components within normal limits  I-STAT CHEM 8, ED - Abnormal; Notable for the following:    Potassium 2.7 (*)    Hemoglobin 12.6 (*)    HCT 37.0 (*)    All other components within normal limits  CK  CK  I-STAT TROPOININ, ED  I-STAT TROPOININ, ED    EKG  EKG Interpretation  Date/Time:  Tuesday October 07 2016 15:38:57 EDT Ventricular Rate:  67 PR Interval:    QRS Duration: 97 QT Interval:  387 QTC Calculation: 409 R Axis:   27 Text Interpretation:  Sinus rhythm Low voltage, precordial leads Consider anterior infarct No interval change Confirmed by Erma Heritage MD, Sheria Lang [phone]) on 10/07/2016 3:45:10 PM       Radiology Dg Chest Portable 1 View  Result Date: 10/07/2016 CLINICAL DATA:  Chest pain.  Vomiting. EXAM: PORTABLE CHEST 1 VIEW COMPARISON:  11/12/2015 FINDINGS: Heart size and pulmonary vascularity are normal and the lungs are clear. No effusions. No acute bone abnormality. Calcification in the arch of the aorta. IMPRESSION: No acute abnormalities. Aortic atherosclerosis. Electronically Signed   By: Francene Boyers M.D.   On: 10/07/2016 16:07    Procedures Procedures (including critical care time)  Medications  Ordered in ED Medications  nitroGLYCERIN (NITROSTAT) SL tablet 0.4 mg (not administered)  aspirin chewable tablet 324 mg (324 mg Oral Given 10/07/16 1608)  potassium chloride SA (K-DUR,KLOR-CON) CR tablet 40 mEq (40 mEq Oral Given 10/07/16 1643)  potassium chloride 30 mEq in sodium chloride 0.9 % 265 mL (KCL MULTIRUN) IVPB (30 mEq Intravenous Given 10/07/16 1643)  magnesium sulfate IVPB 2 g 50 mL (0 g Intravenous Stopped 10/07/16 1917)  potassium chloride SA (K-DUR,KLOR-CON) CR tablet 40 mEq (40 mEq Oral Given 10/07/16 1948)  magnesium oxide (MAG-OX) tablet 400 mg (400 mg Oral Given 10/07/16 1958)     Initial Impression / Assessment and Plan / ED Course  I have reviewed the triage vital signs and the nursing notes.  Pertinent labs & imaging results that were available during my care of the patient were reviewed by me and considered in my medical decision making (see chart for details).    71 yo M with PMHx as above here with diffuse body aching/cramping. On arrival, pt in no apparent distress. He has mild muscular TTP. EKG is non-ischemic but does show some ST flattening in anterior leads, which I suspect is chronic 2/2 his old MI. D/w Dr. Herbie Baltimore and Dr. Katrinka Blazing of Cardiology who evaluated pt - do not feel this is ischemic or cardiac. Labs show hypokalemia and hypomagnesemia which is likely etiology, and pt has h/o same. He has a poor diet. Pt was repleted with K, Mag in ED with resolution of his sx. He is ambulatory without difficulty. Trop neg x 2 and Cards has cleared. Given well appearance and Mag, K load in ED, will d/c with outpt supplementation, outpt follow-up.  Final Clinical Impressions(s) / ED Diagnoses   Final diagnoses:  Hypokalemia  Hypomagnesemia  Cramping of hands    New Prescriptions Discharge Medication List as of 10/07/2016  9:35 PM    START taking these medications   Details  magnesium oxide (MAG-OX) 400 (241.3 Mg) MG tablet Take 1 tablet (400 mg total) by mouth 2 (two) times  daily., Starting Tue 10/07/2016, Until Fri 10/10/2016, Print    potassium chloride SA (K-DUR,KLOR-CON) 20 MEQ tablet Take 2 tablets (40 mEq total) by mouth 2 (two) times daily., Starting Tue 10/07/2016, Until Fri 10/10/2016, Print  Shaune Pollack, MD 10/07/16 [phone]

## 2016-10-07 NOTE — ED Triage Notes (Signed)
To ED for eval of cramping in hands and right leg. Started pta. Pt very diaphoretic. Hx of MI and pt states symptoms were similar to this. No cp. Pt vomiting in triage.

## 2016-10-07 NOTE — ED Notes (Signed)
Pt stable, understands discharge instructions, and reasons for return.   

## 2016-11-25 ENCOUNTER — Other Ambulatory Visit: Payer: Self-pay | Admitting: Internal Medicine

## 2016-11-25 DIAGNOSIS — M79662 Pain in left lower leg: Principal | ICD-10-CM

## 2016-11-25 DIAGNOSIS — M79661 Pain in right lower leg: Secondary | ICD-10-CM

## 2016-11-25 DIAGNOSIS — I739 Peripheral vascular disease, unspecified: Secondary | ICD-10-CM

## 2016-12-02 ENCOUNTER — Ambulatory Visit
Admission: RE | Admit: 2016-12-02 | Discharge: 2016-12-02 | Disposition: A | Discharge status: Home / Self Care | Payer: Medicare Other | Source: Ambulatory Visit | Attending: Internal Medicine | Admitting: Internal Medicine

## 2016-12-02 DIAGNOSIS — M79662 Pain in left lower leg: Principal | ICD-10-CM

## 2016-12-02 DIAGNOSIS — I739 Peripheral vascular disease, unspecified: Secondary | ICD-10-CM

## 2016-12-02 DIAGNOSIS — M79661 Pain in right lower leg: Secondary | ICD-10-CM

## 2016-12-05 ENCOUNTER — Emergency Department (HOSPITAL_COMMUNITY): Payer: Medicare Other

## 2016-12-05 ENCOUNTER — Observation Stay (HOSPITAL_COMMUNITY)
Admission: EM | Admit: 2016-12-05 | Discharge: 2016-12-05 | Disposition: A | Discharge status: Home / Self Care | Payer: Medicare Other | Attending: Internal Medicine | Admitting: Internal Medicine

## 2016-12-05 ENCOUNTER — Encounter (HOSPITAL_COMMUNITY): Payer: Self-pay

## 2016-12-05 ENCOUNTER — Observation Stay (HOSPITAL_BASED_OUTPATIENT_CLINIC_OR_DEPARTMENT_OTHER): Payer: Medicare Other

## 2016-12-05 DIAGNOSIS — I2510 Atherosclerotic heart disease of native coronary artery without angina pectoris: Secondary | ICD-10-CM | POA: Diagnosis not present

## 2016-12-05 DIAGNOSIS — R0789 Other chest pain: Secondary | ICD-10-CM | POA: Diagnosis not present

## 2016-12-05 DIAGNOSIS — E1151 Type 2 diabetes mellitus with diabetic peripheral angiopathy without gangrene: Secondary | ICD-10-CM | POA: Diagnosis not present

## 2016-12-05 DIAGNOSIS — Z9861 Coronary angioplasty status: Secondary | ICD-10-CM

## 2016-12-05 DIAGNOSIS — M1990 Unspecified osteoarthritis, unspecified site: Secondary | ICD-10-CM | POA: Diagnosis not present

## 2016-12-05 DIAGNOSIS — E1159 Type 2 diabetes mellitus with other circulatory complications: Secondary | ICD-10-CM | POA: Diagnosis not present

## 2016-12-05 DIAGNOSIS — Z7982 Long term (current) use of aspirin: Secondary | ICD-10-CM | POA: Diagnosis not present

## 2016-12-05 DIAGNOSIS — Z7902 Long term (current) use of antithrombotics/antiplatelets: Secondary | ICD-10-CM | POA: Insufficient documentation

## 2016-12-05 DIAGNOSIS — Z720 Tobacco use: Secondary | ICD-10-CM | POA: Diagnosis present

## 2016-12-05 DIAGNOSIS — K219 Gastro-esophageal reflux disease without esophagitis: Secondary | ICD-10-CM | POA: Diagnosis not present

## 2016-12-05 DIAGNOSIS — Z87891 Personal history of nicotine dependence: Secondary | ICD-10-CM | POA: Diagnosis not present

## 2016-12-05 DIAGNOSIS — E7800 Pure hypercholesterolemia, unspecified: Secondary | ICD-10-CM | POA: Diagnosis not present

## 2016-12-05 DIAGNOSIS — Z955 Presence of coronary angioplasty implant and graft: Secondary | ICD-10-CM | POA: Diagnosis not present

## 2016-12-05 DIAGNOSIS — E785 Hyperlipidemia, unspecified: Secondary | ICD-10-CM | POA: Diagnosis not present

## 2016-12-05 DIAGNOSIS — Z794 Long term (current) use of insulin: Secondary | ICD-10-CM | POA: Insufficient documentation

## 2016-12-05 DIAGNOSIS — E782 Mixed hyperlipidemia: Secondary | ICD-10-CM | POA: Diagnosis not present

## 2016-12-05 DIAGNOSIS — I10 Essential (primary) hypertension: Secondary | ICD-10-CM | POA: Diagnosis present

## 2016-12-05 DIAGNOSIS — R079 Chest pain, unspecified: Secondary | ICD-10-CM | POA: Diagnosis not present

## 2016-12-05 DIAGNOSIS — M545 Low back pain: Secondary | ICD-10-CM | POA: Diagnosis not present

## 2016-12-05 DIAGNOSIS — G8929 Other chronic pain: Secondary | ICD-10-CM | POA: Insufficient documentation

## 2016-12-05 DIAGNOSIS — I739 Peripheral vascular disease, unspecified: Secondary | ICD-10-CM | POA: Diagnosis present

## 2016-12-05 DIAGNOSIS — E119 Type 2 diabetes mellitus without complications: Secondary | ICD-10-CM

## 2016-12-05 DIAGNOSIS — I700 Atherosclerosis of aorta: Secondary | ICD-10-CM | POA: Insufficient documentation

## 2016-12-05 DIAGNOSIS — I252 Old myocardial infarction: Secondary | ICD-10-CM | POA: Diagnosis not present

## 2016-12-05 LAB — NM MYOCAR MULTI W/SPECT W/WALL MOTION / EF
CHL CUP RESTING HR STRESS: 66 {beats}/min
CHL RATE OF PERCEIVED EXERTION: 10
CSEPEW: 7 METS
Exercise duration (min): 6 min
Exercise duration (sec): 9 s
MPHR: 148 {beats}/min
Peak HR: 153 {beats}/min
Percent HR: 103 %

## 2016-12-05 LAB — BASIC METABOLIC PANEL
ANION GAP: 9 (ref 5–15)
BUN: 15 mg/dL (ref 6–20)
CALCIUM: 8.2 mg/dL — AB (ref 8.9–10.3)
CO2: 26 mmol/L (ref 22–32)
CREATININE: 0.95 mg/dL (ref 0.61–1.24)
Chloride: 106 mmol/L (ref 101–111)
GLUCOSE: 113 mg/dL — AB (ref 65–99)
Potassium: 3.5 mmol/L (ref 3.5–5.1)
Sodium: 141 mmol/L (ref 135–145)

## 2016-12-05 LAB — LIPID PANEL
CHOLESTEROL: 116 mg/dL (ref 0–200)
HDL: 36 mg/dL — ABNORMAL LOW (ref >40)
LDL Cholesterol: 71 mg/dL (ref 0–99)
TRIGLYCERIDES: 43 mg/dL (ref <150)
Total CHOL/HDL Ratio: 3.2 RATIO
VLDL: 9 mg/dL (ref 0–40)

## 2016-12-05 LAB — CBC
HCT: 36 % — ABNORMAL LOW (ref 39.0–52.0)
HEMOGLOBIN: 12 g/dL — AB (ref 13.0–17.0)
MCH: 29.9 pg (ref 26.0–34.0)
MCHC: 33.3 g/dL (ref 30.0–36.0)
MCV: 89.6 fL (ref 78.0–100.0)
PLATELETS: 296 10*3/uL (ref 150–400)
RBC: 4.02 MIL/uL — AB (ref 4.22–5.81)
RDW: 13 % (ref 11.5–15.5)
WBC: 5.7 10*3/uL (ref 4.0–10.5)

## 2016-12-05 LAB — GLUCOSE, CAPILLARY
GLUCOSE-CAPILLARY: 80 mg/dL (ref 65–99)
GLUCOSE-CAPILLARY: 57 mg/dL — AB (ref 65–99)
Glucose-Capillary: 52 mg/dL — ABNORMAL LOW (ref 65–99)
Glucose-Capillary: 93 mg/dL (ref 65–99)
Glucose-Capillary: 204 mg/dL — ABNORMAL HIGH (ref 65–99)

## 2016-12-05 LAB — TROPONIN I
Troponin I: <0.03 ng/mL (ref <0.03)
Troponin I: <0.03 ng/mL (ref <0.03)

## 2016-12-05 LAB — CBG MONITORING, ED: Glucose-Capillary: 78 mg/dL (ref 65–99)

## 2016-12-05 MED ORDER — BENAZEPRIL HCL 40 MG PO TABS
40.0000 mg | ORAL_TABLET | Freq: Every day | ORAL | Status: DC
  Filled 2016-12-05: qty 1

## 2016-12-05 MED ORDER — INSULIN GLARGINE 100 UNIT/ML ~~LOC~~ SOLN
18.0000 [IU] | Freq: Every day | SUBCUTANEOUS | Status: DC
  Filled 2016-12-05: qty 0.18

## 2016-12-05 MED ORDER — CLOPIDOGREL BISULFATE 75 MG PO TABS
75.0000 mg | ORAL_TABLET | Freq: Every day | ORAL | Status: DC
  Filled 2016-12-05: qty 1

## 2016-12-05 MED ORDER — SODIUM CHLORIDE 0.9 % IV SOLN
INTRAVENOUS | Status: AC
  Administered 2016-12-05: 13:00:00 via INTRAVENOUS

## 2016-12-05 MED ORDER — INSULIN ASPART 100 UNIT/ML ~~LOC~~ SOLN: 0.0000 [IU] | SUBCUTANEOUS | Status: DC

## 2016-12-05 MED ORDER — DEXTROSE 50 % IV SOLN
25.0000 mL | Freq: Once | INTRAVENOUS | Status: AC
  Administered 2016-12-05: 25 mL via INTRAVENOUS

## 2016-12-05 MED ORDER — ACETAMINOPHEN 325 MG PO TABS
650.0000 mg | ORAL_TABLET | ORAL | Status: DC | PRN
Start: 2016-12-05 — End: 2016-12-05

## 2016-12-05 MED ORDER — ENOXAPARIN SODIUM 40 MG/0.4ML ~~LOC~~ SOLN
40.0000 mg | SUBCUTANEOUS | Status: DC
  Filled 2016-12-05: qty 0.4

## 2016-12-05 MED ORDER — DEXTROSE 50 % IV SOLN
INTRAVENOUS | Status: AC
  Administered 2016-12-05: 25 mL via INTRAVENOUS
  Filled 2016-12-05: qty 50

## 2016-12-05 MED ORDER — POTASSIUM CHLORIDE CRYS ER 10 MEQ PO TBCR
10.0000 meq | EXTENDED_RELEASE_TABLET | Freq: Every day | ORAL | Status: DC
  Administered 2016-12-05: 10 meq via ORAL
  Filled 2016-12-05 (×2): qty 1

## 2016-12-05 MED ORDER — HYDROCHLOROTHIAZIDE 25 MG PO TABS: 25.0000 mg | ORAL_TABLET | Freq: Every day | ORAL | Status: DC

## 2016-12-05 MED ORDER — TECHNETIUM TC 99M TETROFOSMIN IV KIT
10.0000 | PACK | Freq: Once | INTRAVENOUS | Status: AC | PRN
  Administered 2016-12-05: 10 via INTRAVENOUS

## 2016-12-05 MED ORDER — AMLODIPINE BESYLATE 10 MG PO TABS
10.0000 mg | ORAL_TABLET | Freq: Every day | ORAL | Status: DC
  Administered 2016-12-05: 10 mg via ORAL
  Filled 2016-12-05: qty 1

## 2016-12-05 MED ORDER — ASPIRIN EC 81 MG PO TBEC
81.0000 mg | DELAYED_RELEASE_TABLET | Freq: Every day | ORAL | Status: DC
  Filled 2016-12-05: qty 1

## 2016-12-05 MED ORDER — ASPIRIN 81 MG PO CHEW
324.0000 mg | CHEWABLE_TABLET | Freq: Once | ORAL | Status: AC
  Administered 2016-12-05: 324 mg via ORAL
  Filled 2016-12-05: qty 4

## 2016-12-05 MED ORDER — TECHNETIUM TC 99M TETROFOSMIN IV KIT
30.0000 | PACK | Freq: Once | INTRAVENOUS | Status: AC | PRN
  Administered 2016-12-05: 30 via INTRAVENOUS

## 2016-12-05 MED ORDER — NITROGLYCERIN 0.4 MG SL SUBL: 0.4000 mg | SUBLINGUAL_TABLET | SUBLINGUAL | Status: DC | PRN

## 2016-12-05 MED ORDER — MAGNESIUM OXIDE 400 (241.3 MG) MG PO TABS
400.0000 mg | ORAL_TABLET | Freq: Every day | ORAL | Status: DC
  Administered 2016-12-05: 400 mg via ORAL
  Filled 2016-12-05: qty 1

## 2016-12-05 MED ORDER — METOPROLOL SUCCINATE ER 25 MG PO TB24
25.0000 mg | ORAL_TABLET | Freq: Every day | ORAL | Status: DC
  Administered 2016-12-05: 25 mg via ORAL
  Filled 2016-12-05: qty 1

## 2016-12-05 MED ORDER — AMLODIPINE BESY-BENAZEPRIL HCL 10-40 MG PO CAPS: 1.0000 | ORAL_CAPSULE | Freq: Every day | ORAL | Status: DC

## 2016-12-05 MED ORDER — NITROGLYCERIN 0.4 MG SL SUBL
0.4000 mg | SUBLINGUAL_TABLET | Freq: Once | SUBLINGUAL | Status: AC
  Administered 2016-12-05: 0.4 mg via SUBLINGUAL
  Filled 2016-12-05: qty 1

## 2016-12-05 MED ORDER — PANTOPRAZOLE SODIUM 40 MG PO TBEC
40.0000 mg | DELAYED_RELEASE_TABLET | Freq: Every day | ORAL | Status: DC
  Administered 2016-12-05: 40 mg via ORAL
  Filled 2016-12-05: qty 1

## 2016-12-05 MED ORDER — ONDANSETRON HCL 4 MG/2ML IJ SOLN: 4.0000 mg | Freq: Four times a day (QID) | INTRAMUSCULAR | Status: DC | PRN

## 2016-12-05 NOTE — Consult Note (Addendum)
Cardiology Consult    Patient ID: Austin Mclaughlin MRN: [id number], DOB/AGE: [birth year]   Admit date: 12/05/2016 Date of Consult: 12/05/2016  Primary Physician: Ralene OkMoreira, Roy, MD Primary Cardiologist: Dr. Allyson SabalBerry Requesting Provider: Dr. Jomarie LongsJoseph  Reason for Consult: chest pain  Patient Profile    Austin Mclaughlin has a PMH significant for CAD with MI (2008, s/p PCI Cx and OM), HTN, DM, GERD, and chronic back pain. [facility] a 2-day history of chest pain.   Austin Mclaughlin is a 71 y.o. male who is being seen today for the evaluation of chest pain at the request of Dr. Jomarie LongsJoseph.   Past Medical History   Past Medical History:  Diagnosis Date  . Arthritis    "all over" (02/17/2013)  . Chronic lower back pain   . Coronary artery disease   . GERD (gastroesophageal reflux disease)   . High cholesterol   . Hypertension   . Myocardial infarction [facility](H[facility]) 2008   stents to AV CFX & OM  . Type II diabetes mellitus (HCC)     Past Surgical History:  Procedure Laterality Date  . CARDIAC CATHETERIZATION  02/15/2013  . CORONARY ANGIOPLASTY WITH STENT PLACEMENT  2008   DES AV CFX & OM2  . NM MYOVIEW LTD  2016   Normal    Allergies  No Known Allergies  History of Present Illness    Austin Mclaughlin is known to this service and last saw Dr. Allyson SabalBerry in clinic on 09/03/16. He was in his usual state of health at that time and asymptomatic. He is s/p STEMI and DES x 2 to Cx and OMII (2008) and negative myoview in 2016.  On my interview, he states he started having chest tightness 3 days ago. 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 He states he felt the left-sided chest tightness at work when lifting mail bags. He rates the pain as a 5/10, as a tightness that waxes and wanes. He states nothing makes the pain better. The pain "moved" to substernal and to the right chest; he thought the pain was from gas. He worked off of his shift at 0930 yesterday morning and did not have the  pain again until he went to bed last night. The pain returned and he could not get comfortable enough to sleep. [facility].  Troponins have been negative and his EKG shows T wave upsloping in precordial leads that are new from April 2018 EKG.  Inpatient Medications    . amLODipine  10 mg Oral Daily  . aspirin EC  81 mg Oral Daily  . clopidogrel  75 mg Oral Daily  . enoxaparin (LOVENOX) injection  40 mg Subcutaneous Q24H  . insulin aspart  0-9 Units Subcutaneous Q4H  . insulin glargine  18 Units Subcutaneous Daily  . magnesium oxide  400 mg Oral Daily  . metoprolol succinate  25 mg Oral Daily  . pantoprazole  40 mg Oral Daily  . potassium chloride  10 mEq Oral Daily     Outpatient Medications    Prior to Admission medications   Medication Sig Start Date End Date Taking? Authorizing Provider  amLODipine-benazepril (LOTREL) 10-40 MG capsule Take 1 capsule by mouth daily. 09/24/16  Yes [provider]  aspirin EC 81 MG EC tablet Take 1 tablet (81 mg total) by mouth daily. 02/17/13  Yes Robbie LisSimmons, Brittainy M, PA-C  clopidogrel (PLAVIX) 75 MG tablet Take 75  mg by mouth daily.   Yes [provider]  glimepiride (AMARYL) 2 MG tablet Take 2 mg by mouth daily.   Yes [provider]  hydrochlorothiazide (HYDRODIURIL) 25 MG tablet Take 25 mg by mouth daily.  11/05/15  Yes [provider]  insulin glargine (LANTUS) 100 UNIT/ML injection Inject 36 Units into the skin every morning.    Yes [provider]  magnesium oxide (MAG-OX) 400 MG tablet Take 400 mg by mouth daily.   Yes [provider]  metFORMIN (GLUCOPHAGE) 500 MG tablet Take 500 mg by mouth 2 (two) times daily. 11/07/15  Yes [provider]  metoprolol succinate (TOPROL XL) 25 MG 24 hr tablet Take 1 tablet (25 mg total) by mouth daily. 06/04/16  Yes Barrett, Joline Salt, PA-C  nitroGLYCERIN (NITROSTAT) 0.4 MG SL tablet Place 1 tablet (0.4 mg total) under  the tongue every 5 (five) minutes x 3 doses as needed for chest pain. 02/17/13  Yes Sharol Harness, Brittainy M, PA-C  pantoprazole (PROTONIX) 40 MG tablet Take 40 mg by mouth daily.   Yes [provider]  potassium chloride (K-DUR) 10 MEQ tablet Take 10 mEq by mouth daily. 09/01/16  Yes [provider]     Family History    Family History  Problem Relation Age of Onset  . Hypertension Father     Social History    Social History   Social History  . Marital status: Married    Spouse name: [name]  . Number of children: [name]  . Years of education: [name]   Occupational History  . [employer]     Delivers meds to hospitals and Pharmacies   Social History Main Topics  . Smoking status: Former Smoker    Packs/day: 1.00    Years: 39.00    Types: Cigarettes    Quit date: 04/17/2014  . Smokeless tobacco: Never Used     Comment: every once in a while  . Alcohol use 8.4 oz/week    12 Cans of beer, 2 Shots of liquor per week     Comment: 02/17/2013 "6 beers/weekend day"  . Drug use: No  . Sexual activity: Yes   Other Topics Concern  . Not on file   Social History Narrative  . No narrative on file     Review of Systems    General:  No chills, fever, night sweats or weight changes.  Cardiovascular:  + chest pain, no dyspnea on exertion, edema, orthopnea, palpitations, paroxysmal nocturnal dyspnea. Dermatological: No rash, lesions/masses Respiratory: No cough, dyspnea Urologic: No hematuria, dysuria Abdominal:   No nausea, vomiting, diarrhea, bright red blood per rectum, melena, or hematemesis Neurologic:  No visual changes, changes in mental status. All other systems reviewed and are otherwise negative except as noted above.  Physical Exam    Blood pressure (!) 125/53, pulse (!) 58, temperature 98.1 F (36.7 C), temperature source Oral, resp. rate 16, SpO2 100 %.  General: Pleasant, NAD Psych: Normal affect. Neuro: Alert and oriented X 3. Moves all  extremities spontaneously. HEENT: Normal  Neck: Supple without bruits or JVD. Lungs:  Resp regular and unlabored, CTA. Heart: Regular rhythm, bradycardic rate,  no s3, s4, or murmurs. Abdomen: Soft, non-tender, non-distended, BS + x 4.  Extremities: No clubbing, cyanosis or edema. DP/PT/Radials 2+ and equal bilaterally.  Labs    Troponin (Point of Care Test) No results for input(s): TROPIPOC in the last 72 hours.  Recent Labs  12/05/16 0243 12/05/16 1610 12/05/16 0809  TROPONINI <0.03 <0.03 <0.03   Lab Results  Component Value Date   WBC 5.7 12/05/2016   HGB 12.0 (L) 12/05/2016   HCT 36.0 (L) 12/05/2016   MCV 89.6 12/05/2016   PLT 296 12/05/2016     Recent Labs Lab 12/05/16 0243  NA 141  K 3.5  CL 106  CO2 26  BUN 15  CREATININE 0.95  CALCIUM 8.2*  GLUCOSE 113*   Lab Results  Component Value Date   CHOL 185 02/16/2013   HDL 40 02/16/2013   LDLCALC 111 (H) 02/16/2013   TRIG 171 (H) 02/16/2013   No results found for: [facility]   Radiology Studies    Dg Chest 2 View  Result Date: 12/05/2016 CLINICAL DATA:  Chest pain for 1 day EXAM: CHEST  2 VIEW COMPARISON:  10/07/2016 FINDINGS: The heart size and mediastinal contours are within normal limits. Aortic atherosclerosis. Both lungs are clear. The visualized skeletal structures are unremarkable. IMPRESSION: No active cardiopulmonary disease. Electronically Signed   By: Jasmine Pang M.D.   On: 12/05/2016 03:37   [place] Low Ext Art Bil W/exercise  Result Date: 12/03/2016 CLINICAL DATA:  Bilateral lower extremity rest pain and history of hypertension and hyperlipidemia. Evidence of peripheral vascular disease by prior noninvasive evaluation in 2015. History of coronary artery disease. EXAM: NONINVASIVE PHYSIOLOGIC VASCULAR STUDY OF BILATERAL LOWER EXTREMITIES WITH AND WITHOUT EXERCISE TECHNIQUE: Evaluation of both lower extremities were performed at rest, including calculation of ankle-brachial indices, multiple segmental  pressure evaluation, segmental Doppler and segmental pulse volume recording. Ankle brachial indices were also obtained following treadmill exercise. COMPARISON:  Prior noninvasive lower extremity study on 07/25/2013 FINDINGS: RESTING Right ABI: 0.54 Left ABI: 0.74 Right lower extremity: The femoral waveform is biphasic. Popliteal, posterior tibial and dorsalis pedis waveforms are depressed and monophasic. First toe pressure is 65 mm Hg with estimated toe brachial index of 0.48. Pulse volume recordings supports SFA occlusive disease. Left lower extremity: The femoral waveform is biphasic. The popliteal waveform is biphasic and nearly monophasic. The posterior tibial and dorsalis pedis waveforms are monophasic. First toe pressure is 75 mm Hg with estimated toe brachial index of 0.55. POST EXERCISE Right ABI: 0.15 Left ABI: 0.44 Right lower extremity: After 5 minutes of treadmill exercise, there was a significant drop in the right ABI down to 0.15. With rest, this gradually increased back to baseline. Left lower extremity: After 5 minutes of treadmill exercise, there was a drop in the left ABI to 0.44. After rest, this gradually increased back to baseline. IMPRESSION: Progression of arterial occlusive disease in both lower extremities, particularly in the right lower extremity by noninvasive evaluation today. Segmental evaluation supports primarily a pattern of SFA occlusive disease bilaterally. The right ankle-brachial index drops severely with treadmill exercise. The left ankle-brachial index drops moderately with treadmill exercise. Electronically Signed   By: Irish Lack M.D.   On: 12/03/2016 08:01    ECG & Cardiac Imaging    EKG 12/05/16: sinus rhythm, upsloping T waves in precordial leads  Myoview 08/23/14: Normal stress nuclear study  Left Heart Catheterization 12/25/06: OVERALL IMPRESSION:  Successful PCI and stenting of the AV groove  circumflex and OM II using Taxus drug-eluting stent and Angiomax.   The  patient tolerated the procedure well.  Guidewire and catheter removed.  The sheath  was sewn securely in place.  The patient left the lab in stable  condition.  The sheath will be removed in 2 hours.  The patient will be  discharged home  the morning with aspirin and Plavix, will see me back in  the office in 1 or 2 weeks at followup.  Dr. Jacqulyn Bath office was  notified.  Assessment & Plan    1. Chest pain, hx of CAD s/p STEMI and DES to Cx and OMII (2008) - troponin x 3 negative - EKG T wave upsloping in precordial leads consistent with repolarization abnormality and poor R wave progression that is not new Austin Mclaughlin reports typical and atypical features of chest pain. He has been compliant on all of his medications, including ASA, plavix, HCTZ, torpol, lotrel, and lipitor. Given his history, discussed with attending and will proceed with a treadmill myoview. Given his NPO status, will hydrate with IVF. If myoview is low risk, ok for discharge this evening. No echocardiogram unless myoview is abnormal.   2. DM - continue home medications, per primary team   3. Former smoker   4. HLD - continue statin - check lipid panel - pending   Signed, Marcelino Duster, PA-C 12/05/2016, 12:20 PM [phone]  Attending Note:   The patient was seen and examined.  Agree with assessment and plan as noted above.  Changes made to the above note as needed.  Patient seen and independently examined with Bettina Gavia, PA .   We discussed all aspects of the encounter. I agree with the assessment and plan as stated above.  1. Chest pain :  Somewhat atypical . Although he does have CAD and hx of stenting  Will arrange for a myoview today .  2. Hyperlipidemia:   Continue current meds.   3.    I have spent a total of 40 minutes with patient reviewing hospital  notes , telemetry, EKGs, labs and examining patient as well as establishing an assessment and plan that was discussed with the patient.  > 50% of time was spent in direct patient care.    Vesta Mixer, Montez Hageman., MD, [facility] 12/05/2016, 3:44 PM [address]  [address] Office [phone] Pager [phone]

## 2016-12-05 NOTE — Progress Notes (Signed)
Hypoglycemic Event  CBG: 52  Treatment: D50 IV 25 mL  Symptoms: None  Follow-up CBG: [id number] CBG Result:80  Possible Reasons for Event: Inadequate meal intake  Comments/MD notified:    Bartholomew BoardsMueller, Phuong Moffatt Anne

## 2016-12-05 NOTE — ED Notes (Signed)
ED Provider at bedside. 

## 2016-12-05 NOTE — Progress Notes (Signed)
   [facility].  No immediate complications.  Stress imaging is pending at this time.  Preliminary EKG findings may be listed in the chart, but the stress test result will not be finalized until perfusion imaging is complete.  1 day study, [facility] to read.  Leanna BattlesBarrett, [name], PA-C 12/05/2016, 2:39 PM

## 2016-12-05 NOTE — ED Notes (Signed)
Patient transported to X-ray 

## 2016-12-05 NOTE — H&P (Signed)
History and Physical    Austin Mclaughlin RUE:[id number] DOB: 1946-02-14 DOA: 12/05/2016  PCP: Ralene Ok, MD  Patient coming from: Home  I have personally briefly reviewed patient's old medical records in [facility]  Chief Complaint: Chest pain  HPI: Austin Mclaughlin is a 71 y.o. male with medical history significant of CAD, stent x2 to CFX and OM2 in 2008, repeat cath in 2014, last stress test 2016, PAD that has actually been causing LE claudication recently so he just got an [place] on Wed which actually shows disease progression since 2015.  Patient presents to the ED with c/o chest pain.  Patient was at work 6 hours ago, developed moderate L sided chest pain that was non-radiating.  No associated nausea, diaphoresis, nor dyspnea.  Symptoms improved / resolved while triage without any pre-hospital treatment.  In 2008 he only had mild dyspnea prior to stress test (which was positive and he ended up with 2 stents from).   ED Course: Trop neg, EKG unchanged.   Review of Systems: As per HPI otherwise 10 point review of systems negative.   Past Medical History:  Diagnosis Date  . Arthritis    "all over" (02/17/2013)  . Chronic lower back pain   . Coronary artery disease   . GERD (gastroesophageal reflux disease)   . High cholesterol   . Hypertension   . Myocardial infarction [facility]) 2008   stents to AV CFX & OM  . Type II diabetes mellitus (HCC)     Past Surgical History:  Procedure Laterality Date  . CARDIAC CATHETERIZATION  02/15/2013  . CORONARY ANGIOPLASTY WITH STENT PLACEMENT  2008   DES AV CFX & OM2  . NM MYOVIEW LTD  2016   Normal     reports that he quit smoking about 2 years ago. His smoking use included Cigarettes. He has a 39.00 pack-year smoking history. He has never used smokeless tobacco. He reports that he drinks about 8.4 oz of alcohol per week . He reports that he does not use drugs.  No Known Allergies  No family history on file.   Prior to Admission medications    Medication Sig Start Date End Date Taking? Authorizing Provider  amLODipine-benazepril (LOTREL) 10-40 MG capsule Take 1 capsule by mouth daily. 09/24/16  Yes [provider]  aspirin EC 81 MG EC tablet Take 1 tablet (81 mg total) by mouth daily. 02/17/13  Yes Robbie Lis M, PA-C  clopidogrel (PLAVIX) 75 MG tablet Take 75 mg by mouth daily.   Yes [provider]  glimepiride (AMARYL) 2 MG tablet Take 2 mg by mouth daily.   Yes [provider]  hydrochlorothiazide (HYDRODIURIL) 25 MG tablet Take 25 mg by mouth daily.  11/05/15  Yes [provider]  insulin glargine (LANTUS) 100 UNIT/ML injection Inject 36 Units into the skin every morning.    Yes [provider]  magnesium oxide (MAG-OX) 400 MG tablet Take 400 mg by mouth daily.   Yes [provider]  metFORMIN (GLUCOPHAGE) 500 MG tablet Take 500 mg by mouth 2 (two) times daily. 11/07/15  Yes [provider]  metoprolol succinate (TOPROL XL) 25 MG 24 hr tablet Take 1 tablet (25 mg total) by mouth daily. 06/04/16  Yes Barrett, Joline Salt, PA-C  nitroGLYCERIN (NITROSTAT) 0.4 MG SL tablet Place 1 tablet (0.4 mg total) under the tongue every 5 (five) minutes x 3 doses as needed for chest pain. 02/17/13  Yes Robbie Lis M, PA-C  pantoprazole (PROTONIX)  40 MG tablet Take 40 mg by mouth daily.   Yes [provider]  potassium chloride (K-DUR) 10 MEQ tablet Take 10 mEq by mouth daily. 09/01/16  Yes [provider]    Physical Exam: Vitals:   12/05/16 0415 12/05/16 0430 12/05/16 0445 12/05/16 0515  BP: (!) 122/58 132/63 124/63 133/69  Pulse: 64 65 (!) 59 71  Resp: 13 14 (!) 25 17  Temp:      TempSrc:      SpO2: 96% 99% 94% 96%    Constitutional: NAD, calm, comfortable Eyes: PERRL, lids and conjunctivae normal ENMT: Mucous membranes are moist. Posterior pharynx clear of any exudate or lesions.Normal dentition.  Neck: normal, supple, no masses, no  thyromegaly Respiratory: clear to auscultation bilaterally, no wheezing, no crackles. Normal respiratory effort. No accessory muscle use.  Cardiovascular: Regular rate and rhythm, no murmurs / rubs / gallops. No extremity edema. 2+ pedal pulses. No carotid bruits.  Abdomen: no tenderness, no masses palpated. No hepatosplenomegaly. Bowel sounds positive.  Musculoskeletal: no clubbing / cyanosis. No joint deformity upper and lower extremities. Good ROM, no contractures. Normal muscle tone.  Skin: no rashes, lesions, ulcers. No induration Neurologic: CN 2-12 grossly intact. Sensation intact, DTR normal. Strength 5/5 in all 4.  Psychiatric: Normal judgment and insight. Alert and oriented x 3. Normal mood.    Labs on Admission: I have personally reviewed following labs and imaging studies  CBC:  Recent Labs Lab 12/05/16 0243  WBC 5.7  HGB 12.0*  HCT 36.0*  MCV 89.6  PLT 296   Basic Metabolic Panel:  Recent Labs Lab 12/05/16 0243  NA 141  K 3.5  CL 106  CO2 26  GLUCOSE 113*  BUN 15  CREATININE 0.95  CALCIUM 8.2*   GFR: CrCl cannot be calculated (Unknown ideal weight.). Liver Function Tests: No results for input(s): AST, ALT, ALKPHOS, BILITOT, PROT, ALBUMIN in the last 168 hours. No results for input(s): LIPASE, AMYLASE in the last 168 hours. No results for input(s): AMMONIA in the last 168 hours. Coagulation Profile: No results for input(s): INR, PROTIME in the last 168 hours. Cardiac Enzymes:  Recent Labs Lab 12/05/16 0243  TROPONINI <0.03   BNP (last 3 results) No results for input(s): PROBNP in the last 8760 hours. HbA1C: No results for input(s): HGBA1C in the last 72 hours. CBG: No results for input(s): GLUCAP in the last 168 hours. Lipid Profile: No results for input(s): CHOL, HDL, LDLCALC, TRIG, CHOLHDL, LDLDIRECT in the last 72 hours. Thyroid Function Tests: No results for input(s): TSH, T4TOTAL, FREET4, T3FREE, THYROIDAB in the last 72 hours. Anemia  Panel: No results for input(s): VITAMINB12, FOLATE, FERRITIN, TIBC, IRON, RETICCTPCT in the last 72 hours. Urine analysis:    Component Value Date/Time   BILIRUBINUR neg 07/06/2014 1611   PROTEINUR neg 07/06/2014 1611   UROBILINOGEN 1.0 07/06/2014 1611   NITRITE neg 07/06/2014 1611   LEUKOCYTESUR Negative 07/06/2014 1611    Radiological Exams on Admission: Dg Chest 2 View  Result Date: 12/05/2016 CLINICAL DATA:  Chest pain for 1 day EXAM: CHEST  2 VIEW COMPARISON:  10/07/2016 FINDINGS: The heart size and mediastinal contours are within normal limits. Aortic atherosclerosis. Both lungs are clear. The visualized skeletal structures are unremarkable. IMPRESSION: No active cardiopulmonary disease. Electronically Signed   By: Jasmine PangKim  Fujinaga M.D.   On: 12/05/2016 03:37    EKG: Independently reviewed.  Assessment/Plan Principal Problem:   Chest pain, rule out acute myocardial infarction Active Problems:   CAD (  coronary artery disease)   HTN (hypertension)   DM type 2 (diabetes mellitus, type 2) (HCC)   PAD (peripheral artery disease) (HCC)    1. CP r/o - 1. CP obs pathway 2. Serial trops 3. Tele monitor 4. NPO 5. Call cards in AM 2. CAD - continue ASA and plavix 3. HTN - continue BP meds 4. DM2 - 1. Will put on half home lantus dose 2. Hold PO hypoglycemics 3. Sensitive scale SSI Q4H 5. PAD - 1. Had discussion of results of BLE ultrasound arterial doppler study done on Wed with patient. 2. Likely will need follow up / referral as outpatient to vascular surgery for PAD and claudication (no ulcer nor critical limb ischemia at this time and asymptomatic at rest).  DVT prophylaxis: Lovenox Code Status: Full Family Communication: Wife at bedside Disposition Plan: Home after admit Consults called: None, call cards in AM Admission status: Place in obs   Hillary Bow. DO [facility] Pager [phone]  If 7AM-7PM, please contact day team taking care of  patient [email or web address] Password [email or web address]  12/05/2016, 5:31 AM

## 2016-12-05 NOTE — ED Triage Notes (Signed)
Pt states that he started having CP, tightness, central, nonradiating that started yesterday, denise SOB/n/v. Hx of MI 6-7 years ago

## 2016-12-05 NOTE — ED Provider Notes (Signed)
[facility]-EMERGENCY DEPT Provider Note   CSN: [id number] Arrival date & time: 12/05/16  0235  By signing my name below, I, Elder Negus, attest that this documentation has been prepared under the direction and in the presence of Korrin Waterfield, Canary Brim, MD. Electronically Signed: Elder Negus, Scribe. 12/05/16. 3:52 AM.   History   Chief Complaint Chief Complaint  Patient presents with  . Chest Pain    HPI Austin Mclaughlin is a 71 y.o. male with history of HTN, CAD with cath in 2008 showing ischemia who presents to the ED with chest pain. This patient states that approximately 6 hours ago he was at work when he developed moderate L sided chest pain that was non-radiating. No associated nausea, diaphoresis, or dyspnea. He did not take pre-hospital therapy. While in triage, he states that his symptoms have significantly improved. He is now reporting mild chest "discomfort". When asked, he states that he only had mild dyspnea prior to his stress test in 2008 which demonstrated ischemia.   The history is provided by the patient. No language interpreter was used.  Chest Pain   This is a new problem. The current episode started 3 to 5 hours ago. The problem occurs constantly. The problem has been resolved. Pain location: L sided. The pain is moderate. The pain does not radiate. Pertinent negatives include no diaphoresis, no nausea, no near-syncope and no shortness of breath.    Past Medical History:  Diagnosis Date  . Arthritis    "all over" (02/17/2013)  . Chronic lower back pain   . Coronary artery disease   . GERD (gastroesophageal reflux disease)   . High cholesterol   . Hypertension   . Myocardial infarction [facility]) 2008   stents to AV CFX & OM  . Type II diabetes mellitus [facility])     Patient Active Problem List   Diagnosis Date Noted  . Hypokalemia   . Hyperlipidemia 04/26/2014  . CAD (coronary artery disease) 02/16/2013  . Chest pain 02/16/2013  . Tobacco abuse 02/16/2013  . HTN  (hypertension) 02/16/2013  . DM type 2 (diabetes mellitus, type 2) (HCC) 02/16/2013    Past Surgical History:  Procedure Laterality Date  . CARDIAC CATHETERIZATION  02/15/2013  . CORONARY ANGIOPLASTY WITH STENT PLACEMENT  2008   DES AV CFX & OM2  . NM MYOVIEW LTD  2016   Normal       Home Medications    Prior to Admission medications   Medication Sig Start Date End Date Taking? Authorizing Provider  amLODipine-benazepril (LOTREL) 10-40 MG capsule Take 1 capsule by mouth daily. 09/24/16  Yes [provider]  aspirin EC 81 MG EC tablet Take 1 tablet (81 mg total) by mouth daily. 02/17/13  Yes Robbie Lis M, PA-C  clopidogrel (PLAVIX) 75 MG tablet Take 75 mg by mouth daily.   Yes [provider]  glimepiride (AMARYL) 2 MG tablet Take 2 mg by mouth daily.   Yes [provider]  hydrochlorothiazide (HYDRODIURIL) 25 MG tablet Take 25 mg by mouth daily.  11/05/15  Yes [provider]  insulin glargine (LANTUS) 100 UNIT/ML injection Inject 36 Units into the skin every morning.    Yes [provider]  magnesium oxide (MAG-OX) 400 MG tablet Take 400 mg by mouth daily.   Yes [provider]  metFORMIN (GLUCOPHAGE) 500 MG tablet Take 500 mg by mouth 2 (two) times daily. 11/07/15  Yes [provider]  metoprolol succinate (TOPROL XL) 25 MG 24 hr tablet Take  1 tablet (25 mg total) by mouth daily. 06/04/16  Yes Barrett, Joline Salt, PA-C  nitroGLYCERIN (NITROSTAT) 0.4 MG SL tablet Place 1 tablet (0.4 mg total) under the tongue every 5 (five) minutes x 3 doses as needed for chest pain. 02/17/13  Yes Sharol Harness, Brittainy M, PA-C  pantoprazole (PROTONIX) 40 MG tablet Take 40 mg by mouth daily.   Yes [provider]  potassium chloride (K-DUR) 10 MEQ tablet Take 10 mEq by mouth daily. 09/01/16  Yes [provider]  atorvastatin (LIPITOR) 80 MG tablet Take 1 tablet (80 mg total) by mouth daily. Patient not taking: Reported on  12/05/2016 06/04/16 12/05/16  Barrett, Joline Salt, PA-C  potassium chloride SA (K-DUR,KLOR-CON) 20 MEQ tablet Take 2 tablets (40 mEq total) by mouth 2 (two) times daily. Patient not taking: Reported on 12/05/2016 10/07/16 12/05/16  Shaune Pollack, MD    Family History No family history on file.  Social History Social History  Substance Use Topics  . Smoking status: Former Smoker    Packs/day: 1.00    Years: 39.00    Types: Cigarettes    Quit date: 04/17/2014  . Smokeless tobacco: Never Used     Comment: every once in a while  . Alcohol use 8.4 oz/week    12 Cans of beer, 2 Shots of liquor per week     Comment: 02/17/2013 "6 beers/weekend day"     Allergies   Patient has no known allergies.   Review of Systems Review of Systems  Constitutional: Negative for diaphoresis.  Respiratory: Negative for shortness of breath.   Cardiovascular: Positive for chest pain. Negative for near-syncope.  Gastrointestinal: Negative for nausea.  All other systems reviewed and are negative.    Physical Exam Updated Vital Signs BP 127/63   Pulse 64   Temp 98.1 F (36.7 C) (Oral)   Resp 13   SpO2 94%   Physical Exam  Constitutional: He is oriented to person, place, and time. He appears well-developed and well-nourished. No distress.  HENT:  Head: Normocephalic and atraumatic.  Right Ear: Hearing normal.  Left Ear: Hearing normal.  Nose: Nose normal.  Mouth/Throat: Oropharynx is clear and moist and mucous membranes are normal.  Eyes: Conjunctivae and EOM are normal. Pupils are equal, round, and reactive to light.  Neck: Normal range of motion. Neck supple.  Cardiovascular: Regular rhythm, S1 normal and S2 normal.  Exam reveals no gallop and no friction rub.   No murmur heard. Pulmonary/Chest: Effort normal and breath sounds normal. No respiratory distress. He exhibits no tenderness.  Abdominal: Soft. Normal appearance and bowel sounds are normal. There is no hepatosplenomegaly. There is no  tenderness. There is no rebound, no guarding, no tenderness at McBurney's point and negative Murphy's sign. No hernia.  Musculoskeletal: Normal range of motion.  Neurological: He is alert and oriented to person, place, and time. He has normal strength. No cranial nerve deficit or sensory deficit. Coordination normal. GCS eye subscore is 4. GCS verbal subscore is 5. GCS motor subscore is 6.  Skin: Skin is warm, dry and intact. No rash noted. No cyanosis.  Psychiatric: He has a normal mood and affect. His speech is normal and behavior is normal. Thought content normal.  Nursing note and vitals reviewed.    ED Treatments / Results  Labs (all labs ordered are listed, but only abnormal results are displayed) Labs Reviewed  BASIC METABOLIC PANEL - Abnormal; Notable for the following:       Result Value  Glucose, Bld 113 (*)    Calcium 8.2 (*)    All other components within normal limits  CBC - Abnormal; Notable for the following:    RBC 4.02 (*)    Hemoglobin 12.0 (*)    HCT 36.0 (*)    All other components within normal limits  TROPONIN I    EKG  EKG Interpretation  Date/Time:  Friday December 05 2016 02:46:55 EDT Ventricular Rate:  73 PR Interval:  178 QRS Duration: 76 QT Interval:  368 QTC Calculation: 405 R Axis:   24 Text Interpretation:  Normal sinus rhythm Normal ECG No change since 2004 Confirmed by Rochele RaringWard, Kristen [phone]([id number]) on 12/05/2016 2:55:43 AM       Radiology Dg Chest 2 View  Result Date: 12/05/2016 CLINICAL DATA:  Chest pain for 1 day EXAM: CHEST  2 VIEW COMPARISON:  10/07/2016 FINDINGS: The heart size and mediastinal contours are within normal limits. Aortic atherosclerosis. Both lungs are clear. The visualized skeletal structures are unremarkable. IMPRESSION: No active cardiopulmonary disease. Electronically Signed   By: Jasmine PangKim  Fujinaga M.D.   On: 12/05/2016 03:37    Procedures Procedures (including critical care time)  Medications Ordered in ED Medications  aspirin  chewable tablet 324 mg (not administered)  nitroGLYCERIN (NITROSTAT) SL tablet 0.4 mg (not administered)     Initial Impression / Assessment and Plan / ED Course  I have reviewed the triage vital signs and the nursing notes.  Pertinent labs & imaging results that were available during my care of the patient were reviewed by me and considered in my medical decision making (see chart for details).     Patient with known coronary artery disease, status post stenting in 2008 presents to the ER for evaluation of chest pain. 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 He did not notice if resting made the pain better. He had some tingling in the left arm which concerned him, presented to the ER. At time of arrival to the ER pain is resolving without intervention.  EKG is unremarkable. Troponin is negative. Based on previous cardiovascular disease and advanced age, will require hospitalization for further evaluation (HEART Score 4)  HEART Score for Major Cardiac Events from [employer]co.zaMDCalc.com  on 12/05/2016 ** All calculations should be rechecked by clinician prior to use **  RESULT SUMMARY: 4 points Moderate Score (4-6 points)  Risk of MACE of 12-16.6%.   INPUTS: History -> 0 = Slightly suspicious EKG -> 0 = Normal Age -> 2 = ?65 Risk factors -> 2 = ?3 risk factors or history of atherosclerotic disease Initial troponin -> 0 = ?normal limit   Final Clinical Impressions(s) / ED Diagnoses   Final diagnoses:  Chest pain, unspecified type    New Prescriptions New Prescriptions   No medications on file  I personally performed the services described in this documentation, which was scribed in my presence. The recorded information has been reviewed and is accurate.    Gilda CreasePollina, Joyel Chenette J, MD 12/05/16 [id number]

## 2016-12-05 NOTE — Progress Notes (Signed)
Hypoglycemic Event  CBG: 57  Treatment: D50 IV 25 mL  Symptoms: None  Follow-up CBG: Time:1250 CBG Result:93  Possible Reasons for Event: Inadequate meal intake  Comments/MD notified:    Bartholomew BoardsMueller, Eiliyah Reh Anne

## 2016-12-05 NOTE — Progress Notes (Signed)
Patient seen and examined Mr. Austin Mclaughlin is a 71 year old male admitted this morning by Dr.Gardner History of CAD with PCI and stents 2 to circumflex and OM 2 in 2008, PAD, presents to the ER early this morning with chest pain, which is described as chest pressure . -Troponins negative, EKG without ST T wave changes -Patient is nothing by mouth we will consult cardiology to determine if he would benefit from further ischemic evaluation  Zannie CovePreetha Correne Lalani, MD

## 2016-12-05 NOTE — ED Notes (Signed)
Admitting provider bedside 

## 2016-12-12 ENCOUNTER — Ambulatory Visit: Payer: Medicare Other | Admitting: Cardiovascular Disease

## 2016-12-16 ENCOUNTER — Ambulatory Visit (INDEPENDENT_AMBULATORY_CARE_PROVIDER_SITE_OTHER): Payer: Medicare Other | Admitting: Cardiovascular Disease

## 2016-12-16 ENCOUNTER — Encounter: Payer: Self-pay | Admitting: Cardiovascular Disease

## 2016-12-16 VITALS — BP 140/60 | Ht 63.0 in | Wt 174.0 lb

## 2016-12-16 DIAGNOSIS — R0989 Other specified symptoms and signs involving the circulatory and respiratory systems: Secondary | ICD-10-CM | POA: Diagnosis not present

## 2016-12-16 DIAGNOSIS — I739 Peripheral vascular disease, unspecified: Secondary | ICD-10-CM | POA: Diagnosis not present

## 2016-12-16 DIAGNOSIS — E7800 Pure hypercholesterolemia, unspecified: Secondary | ICD-10-CM | POA: Diagnosis not present

## 2016-12-16 DIAGNOSIS — I2510 Atherosclerotic heart disease of native coronary artery without angina pectoris: Secondary | ICD-10-CM | POA: Diagnosis not present

## 2016-12-16 NOTE — Progress Notes (Signed)
12/16/2016 Austin Mclaughlin   1945/09/25  [id number]  Primary Physician Ralene OkMoreira, Roy, MD Primary Cardiologist: Runell GessJonathan J Ronel Rodeheaver MD Roseanne RenoFACP, FACC, FAHA, FSCAI  HPI:  [name] is a 71 year old Married African American male, father of 4 who I last saw in the office 09/03/16., He has a history of CAD, s/p cath in 2008 after a stress test for CP showed ischemia. The cath, at that time, demonstrated an occluded PDA and collaterals with high grade OM2 disease and proximal segmental AV groove left circ disease. He underwent PCI of the AV groove left circ and OM2 with DES. He had reportedly done well until developing chest pain the day prior to arrival to the ED. The CP had been intermittent for over the course of 24 hours, before recurring and prompting the patient to seek evaluation. He described the pain as a sharp, stinging pain in his left chest with no SOB, nausea or diaphoresis. [facility] he was noted to have 4mm of ST elevation in V1 and V2 and a STEMI was called and [facility]. [facility], his chest pain resolved. He was taken urgently to the cath lab. The procedure was performed by Dr. Allyson SabalBerry via the right femoral artery. It demonstrated a patent LAD stent and diffuse RCA disease. Medical therapy was recommended. He left the cath lab in stable condition. Post-cath, he was noted to have moderate hypertension. He was placed on Lopressor, HCTZ and lisinopril. He had mild improvement in BP. The patient [facility] in [facility] office the Monday after discharge and his lisinopril was up titrated. Since that time he's had no chest pain and for some reason no longer is on an ACE inhibitor. He also has stopped smoking several months ago after smoking one pack per day for 55 years.   Since I saw him back in February he was evaluated in the emergency room at Dr. Elease HashimotoNahser for chest pain. His enzymes were negative and a Myoview stress test was low risk. He has had no recurrent  chest pain since that time. He does complain of some calf claudication.   Current Outpatient Prescriptions  Medication Sig Dispense Refill  . amLODipine-benazepril (LOTREL) 10-40 MG capsule Take 1 capsule by mouth daily.  2  . aspirin EC 81 MG EC tablet Take 1 tablet (81 mg total) by mouth daily.    . clopidogrel (PLAVIX) 75 MG tablet Take 75 mg by mouth daily.    Marland Kitchen. glimepiride (AMARYL) 2 MG tablet Take 2 mg by mouth daily.    . hydrochlorothiazide (HYDRODIURIL) 25 MG tablet Take 25 mg by mouth daily.   5  . insulin glargine (LANTUS) 100 UNIT/ML injection Inject 36 Units into the skin every morning.     . magnesium oxide (MAG-OX) 400 MG tablet Take 400 mg by mouth daily.    . metFORMIN (GLUCOPHAGE) 500 MG tablet Take 500 mg by mouth 2 (two) times daily.  9  . metoprolol succinate (TOPROL XL) 25 MG 24 hr tablet Take 1 tablet (25 mg total) by mouth daily. 90 tablet 3  . nitroGLYCERIN (NITROSTAT) 0.4 MG SL tablet Place 1 tablet (0.4 mg total) under the tongue every 5 (five) minutes x 3 doses as needed for chest pain. 25 tablet 2  . pantoprazole (PROTONIX) 40 MG tablet Take 40 mg by mouth daily.    . potassium chloride (K-DUR) 10 MEQ tablet Take 10 mEq by mouth daily.  3   No current facility-administered medications for this visit.     No Known Allergies  Social History   Social History  . Marital status: Married    Spouse name: [name]  . Number of children: [name]  . Years of education: [name]   Occupational History  . [employer]     Delivers meds to hospitals and Pharmacies   Social History Main Topics  . Smoking status: Former Smoker    Packs/day: 1.00    Years: 39.00    Types: Cigarettes    Quit date: 04/17/2014  . Smokeless tobacco: Never Used     Comment: every once in a while  . Alcohol use 8.4 oz/week    12 Cans of beer, 2 Shots of liquor per week     Comment: 02/17/2013 "6 beers/weekend day"  . Drug use: No  . Sexual activity: Yes   Other Topics Concern  . Not  on file   Social History Narrative  . No narrative on file     Review of Systems: General: negative for chills, fever, night sweats or weight changes.  Cardiovascular: negative for chest pain, dyspnea on exertion, edema, orthopnea, palpitations, paroxysmal nocturnal dyspnea or shortness of breath Dermatological: negative for rash Respiratory: negative for cough or wheezing Urologic: negative for hematuria Abdominal: negative for nausea, vomiting, diarrhea, bright red blood per rectum, melena, or hematemesis Neurologic: negative for visual changes, syncope, or dizziness All other systems reviewed and are otherwise negative except as noted above.    Blood pressure 140/60, height 5\' 3"  (1.6 m), weight 174 lb (78.9 kg).  General appearance: alert and no distress Neck: no adenopathy, no JVD, supple, symmetrical, trachea midline, thyroid not enlarged, symmetric, no tenderness/mass/nodules and Soft bilateral carotid bruits Lungs: clear to auscultation bilaterally Heart: regular rate and rhythm, S1, S2 normal, no murmur, click, rub or gallop Extremities: extremities normal, atraumatic, no cyanosis or edema  EKG not performed today  ASSESSMENT AND PLAN:   HTN (hypertension) History of essential hypertension blood pressure measured at 140/60. He is on amlodipine, benazepril, and hydrochlorothiazide as well as metoprolol. Continue current meds at current dosing  CAD (coronary artery disease) History of CAD status post cardiac catheterization in 2008 demonstrating an occluded PDA with collaterals, high-grade OM 2 disease proximal segmental AV groove circumflex disease. He underwent PCI of the AV groove circumflex as well as a second obtuse marginal branch with drug-eluting stents. He did well until he presented to emergency room at [facility] with recurrent pain and EKG changes. He underwent repeat cardiac cavitation by myself revealing a patent LAD stent with diffuse RCA disease and  medical therapy was recommended. He was recently seen by [facility] with [facility] and Evaluated by Dr. Elease Hashimoto Previous Enzymes Were Negative Myoview Stress Test Was Low Risk with Very Subtle Inferior Ischemia. Since Discharge He Has Denied Chest Pain.  Hyperlipidemia History of hyperlipidemia on statin therapy followed by his PCP  PAD (peripheral artery disease) [facility]) Mr. Heber complains of bilateral calf claudication. We will check lower extremity arterial Doppler studies      [name] MD [facility], [facility] 12/16/2016 3:21 PM

## 2016-12-16 NOTE — Assessment & Plan Note (Signed)
Mr. Austin Mclaughlin complains of bilateral calf claudication. We will check lower extremity arterial Doppler studies

## 2016-12-16 NOTE — Assessment & Plan Note (Signed)
History of hyperlipidemia on statin therapy followed by his PCP 

## 2016-12-16 NOTE — Patient Instructions (Signed)
Medication Instructions: Your physician recommends that you continue on your current medications as directed. Please refer to the Current Medication list given to you today.   Testing/Procedures: Your physician has requested that you have a lower extremity arterial duplex. During this test, ultrasound is used to evaluate arterial blood flow in the legs. Allow one hour for this exam. There are no restrictions or special instructions.  Your physician has requested that you have an ankle brachial index (ABI). During this test an ultrasound and blood pressure cuff are used to evaluate the arteries that supply the arms and legs with blood. Allow thirty minutes for this exam. There are no restrictions or special instructions.  Your physician has requested that you have a carotid duplex. This test is an ultrasound of the carotid arteries in your neck. It looks at blood flow through these arteries that supply the brain with blood. Allow one hour for this exam. There are no restrictions or special instructions.  Follow-Up: We request that you follow-up in: 6 months with an extender and in 12 months with Dr San MorelleBerry  You will receive a reminder letter in the mail two months in advance. If you don't receive a letter, please call our office to schedule the follow-up appointment.  If you need a refill on your cardiac medications before your next appointment, please call your pharmacy.

## 2016-12-16 NOTE — Assessment & Plan Note (Signed)
History of essential hypertension blood pressure measured at 140/60. He is on amlodipine, benazepril, and hydrochlorothiazide as well as metoprolol. Continue current meds at current dosing

## 2016-12-16 NOTE — Assessment & Plan Note (Signed)
History of CAD status post cardiac catheterization in 2008 demonstrating an occluded PDA with collaterals, high-grade OM 2 disease proximal segmental AV groove circumflex disease. He underwent PCI of the AV groove circumflex as well as a second obtuse marginal branch with drug-eluting stents. He did well until [facility] with recurrent pain and EKG changes. He underwent repeat cardiac cavitation by myself revealing a patent LAD stent with diffuse RCA disease and medical therapy was recommended. He was recently seen by Hospital with Recurrent [facility] and Evaluated by Dr. Elease HashimotoNahser Previous Enzymes Were Negative Myoview Stress Test Was Low Risk with Very Subtle Inferior Ischemia. Since Discharge He Has Denied Chest Pain.

## 2016-12-17 NOTE — Discharge Summary (Signed)
Physician Discharge Summary  Belding WUJ:[id number] DOB: [birth year] DOA: 12/05/2016  PCP: Ralene Ok, MD  Admit date: 12/05/2016 Discharge date: 12/05/2016  Time spent: 35 minutes  Recommendations for Outpatient Follow-up:  1. Cards Dr.Berry in 2weeks   Discharge Diagnoses:  Principal Problem:   Chest pain, rule out acute myocardial infarction Active Problems:   CAD (coronary artery disease)   Tobacco abuse   HTN (hypertension)   DM type 2 (diabetes mellitus, type 2) (HCC)   Hyperlipidemia   PAD (peripheral artery disease) (HCC)   Discharge Condition:stable  Diet recommendation: Diabetic  There were no vitals filed for this visit.  History of present illness:  70/M with CAD and admitted with chest pressure  Hospital Course:  70/M with CAD with PCI and stents 2 to circumflex and OM 2 in 2008, PAD, presented to the ER with chest pain, which is described as chest pressure . -ruled out of ACS, troponins negative, EKG without ST T wave changes -Cardiology consulted, underwent Myoview which was low risk and hence discharged home in a stable condition. Rest of his medical problems were stable  Procedures: Myoview: Low risk stress nuclear study with ischemia in the distribution of the distal RCA and borderline reduced left ventricular regional systolic function.    Consultations:  Cards  Discharge Exam: Vitals:   12/05/16 1431 12/05/16 1436  BP: (!) 190/85 (!) 169/71  Pulse:    Resp:    Temp:      General: AAOx3 Cardiovascular: S1S2/RRR Respiratory: CTAB  Discharge Instructions   Discharge Instructions    Diet - low sodium heart healthy    Complete by:  As directed    Increase activity slowly    Complete by:  As directed      Discharge Medication List as of 12/05/2016  5:15 PM    CONTINUE these medications which have NOT CHANGED   Details  amLODipine-benazepril (LOTREL) 10-40 MG capsule Take 1 capsule by mouth daily., Starting Wed 09/24/2016,  Historical Med    aspirin EC 81 MG EC tablet Take 1 tablet (81 mg total) by mouth daily., Starting Thu 02/17/2013, OTC    clopidogrel (PLAVIX) 75 MG tablet Take 75 mg by mouth daily., Historical Med    glimepiride (AMARYL) 2 MG tablet Take 2 mg by mouth daily., Historical Med    hydrochlorothiazide (HYDRODIURIL) 25 MG tablet Take 25 mg by mouth daily. , Starting Mon 11/05/2015, Historical Med    insulin glargine (LANTUS) 100 UNIT/ML injection Inject 36 Units into the skin every morning. , Historical Med    magnesium oxide (MAG-OX) 400 MG tablet Take 400 mg by mouth daily., Historical Med    metFORMIN (GLUCOPHAGE) 500 MG tablet Take 500 mg by mouth 2 (two) times daily., Starting Wed 11/07/2015, Historical Med    metoprolol succinate (TOPROL XL) 25 MG 24 hr tablet Take 1 tablet (25 mg total) by mouth daily., Starting Wed 06/04/2016, Normal    nitroGLYCERIN (NITROSTAT) 0.4 MG SL tablet Place 1 tablet (0.4 mg total) under the tongue every 5 (five) minutes x 3 doses as needed for chest pain., Starting Thu 02/17/2013, Normal    pantoprazole (PROTONIX) 40 MG tablet Take 40 mg by mouth daily., Historical Med    potassium chloride (K-DUR) 10 MEQ tablet Take 10 mEq by mouth daily., Starting Mon 09/01/2016, Historical Med       No Known Allergies Follow-up Information    [name], MD. Go on 12/12/2016.   Specialties:  Cardiology, Radiology Why:  @2 :40pm  Contact information: [address] [place] [address] [phone]            The results of significant diagnostics from this hospitalization (including imaging, microbiology, ancillary and laboratory) are listed below for reference.    Significant Diagnostic Studies: Dg Chest 2 View  Result Date: 12/05/2016 CLINICAL DATA:  Chest pain for 1 day EXAM: CHEST  2 VIEW COMPARISON:  10/07/2016 FINDINGS: The heart size and mediastinal contours are within normal limits. Aortic atherosclerosis. Both lungs are clear. The  visualized skeletal structures are unremarkable. IMPRESSION: No active cardiopulmonary disease. Electronically Signed   By: Jasmine PangKim  Fujinaga M.D.   On: 12/05/2016 03:37   Nm Myocar Multi W/spect W/wall Motion / Ef  Result Date: 12/05/2016  Blood pressure demonstrated a hypertensive response to exercise.  Upsloping ST segment depression ST segment depression was noted during stress in the II, III, V4, V5 and V6 leads, beginning at 3 minutes of stress, and returning to baseline after 1-5 minutes of recovery.  Defect 1: There is a small defect of mild severity present in the mid inferior and apical inferior location.  Findings consistent with ischemia.  This is a low risk study.  The left ventricular ejection fraction is mildly decreased (45-54%).  Nuclear stress EF: 52%.  Low risk stress nuclear study with ischemia in the distribution of the distal RCA and borderline reduced left ventricular regional systolic function.   [place] Low Ext Art Bil W/exercise  Result Date: 12/03/2016 CLINICAL DATA:  Bilateral lower extremity rest pain and history of hypertension and hyperlipidemia. Evidence of peripheral vascular disease by prior noninvasive evaluation in 2015. History of coronary artery disease. EXAM: NONINVASIVE PHYSIOLOGIC VASCULAR STUDY OF BILATERAL LOWER EXTREMITIES WITH AND WITHOUT EXERCISE TECHNIQUE: Evaluation of both lower extremities were performed at rest, including calculation of ankle-brachial indices, multiple segmental pressure evaluation, segmental Doppler and segmental pulse volume recording. Ankle brachial indices were also obtained following treadmill exercise. COMPARISON:  Prior noninvasive lower extremity study on 07/25/2013 FINDINGS: RESTING Right ABI: 0.54 Left ABI: 0.74 Right lower extremity: The femoral waveform is biphasic. Popliteal, posterior tibial and dorsalis pedis waveforms are depressed and monophasic. First toe pressure is 65 mm Hg with estimated toe brachial index of 0.48. Pulse  volume recordings supports SFA occlusive disease. Left lower extremity: The femoral waveform is biphasic. The popliteal waveform is biphasic and nearly monophasic. The posterior tibial and dorsalis pedis waveforms are monophasic. First toe pressure is 75 mm Hg with estimated toe brachial index of 0.55. POST EXERCISE Right ABI: 0.15 Left ABI: 0.44 Right lower extremity: After 5 minutes of treadmill exercise, there was a significant drop in the right ABI down to 0.15. With rest, this gradually increased back to baseline. Left lower extremity: After 5 minutes of treadmill exercise, there was a drop in the left ABI to 0.44. After rest, this gradually increased back to baseline. IMPRESSION: Progression of arterial occlusive disease in both lower extremities, particularly in the right lower extremity by noninvasive evaluation today. Segmental evaluation supports primarily a pattern of SFA occlusive disease bilaterally. The right ankle-brachial index drops severely with treadmill exercise. The left ankle-brachial index drops moderately with treadmill exercise. Electronically Signed   By: Irish LackGlenn  Yamagata M.D.   On: 12/03/2016 08:01    Microbiology: No results found for this or any previous visit (from the past 240 hour(s)).   Labs: Basic Metabolic Panel: No results for input(s): NA, K, CL, CO2, GLUCOSE, BUN, CREATININE, CALCIUM, MG, PHOS in the last 168 hours.  Liver Function Tests: No results for input(s): AST, ALT, ALKPHOS, BILITOT, PROT, ALBUMIN in the last 168 hours. No results for input(s): LIPASE, AMYLASE in the last 168 hours. No results for input(s): AMMONIA in the last 168 hours. CBC: No results for input(s): WBC, NEUTROABS, HGB, HCT, MCV, PLT in the last 168 hours. Cardiac Enzymes: No results for input(s): CKTOTAL, CKMB, CKMBINDEX, TROPONINI in the last 168 hours. BNP: BNP (last 3 results) No results for input(s): BNP in the last 8760 hours.  ProBNP (last 3 results) No results for input(s):  PROBNP in the last 8760 hours.  CBG: No results for input(s): GLUCAP in the last 168 hours.     SignedZannie Cove MD.  [facility] 12/17/2016, 4:10 PM

## 2016-12-29 ENCOUNTER — Ambulatory Visit (HOSPITAL_COMMUNITY)
Admission: RE | Admit: 2016-12-29 | Discharge: 2016-12-29 | Disposition: A | Discharge status: Home / Self Care | Payer: Medicare Other | Source: Ambulatory Visit | Attending: Cardiology | Admitting: Cardiology

## 2016-12-29 DIAGNOSIS — R0989 Other specified symptoms and signs involving the circulatory and respiratory systems: Secondary | ICD-10-CM | POA: Diagnosis not present

## 2016-12-29 DIAGNOSIS — Z87891 Personal history of nicotine dependence: Secondary | ICD-10-CM | POA: Diagnosis not present

## 2016-12-29 DIAGNOSIS — I10 Essential (primary) hypertension: Secondary | ICD-10-CM | POA: Diagnosis not present

## 2016-12-29 DIAGNOSIS — I6523 Occlusion and stenosis of bilateral carotid arteries: Secondary | ICD-10-CM | POA: Diagnosis not present

## 2016-12-29 DIAGNOSIS — I2510 Atherosclerotic heart disease of native coronary artery without angina pectoris: Secondary | ICD-10-CM | POA: Diagnosis not present

## 2017-01-02 ENCOUNTER — Telehealth: Payer: Self-pay | Admitting: *Deleted

## 2017-01-02 NOTE — Telephone Encounter (Signed)
Patient stated that he has a Lower Extremity Arterial doppler scheduled for 01/15/17. He stated that he had one done a month ago with Dr. Ludwig ClarksMoreira. He would like to know if he needs one repeated. He does not think his insurance will pay for another one. Will route to the provider for recommendation.

## 2017-01-02 NOTE — Telephone Encounter (Signed)
Error/ Duplicate

## 2017-01-05 NOTE — Telephone Encounter (Signed)
He doesn't need another doppler but needs ROV to discuss the ones that he had at the hosp

## 2017-01-08 ENCOUNTER — Other Ambulatory Visit: Payer: Self-pay | Admitting: Cardiovascular Disease

## 2017-01-08 DIAGNOSIS — I6521 Occlusion and stenosis of right carotid artery: Secondary | ICD-10-CM

## 2017-01-08 NOTE — Telephone Encounter (Signed)
Spoke to pt. Cancelled doppler scheduled for 7/12 and scheduled him an appt for Tuesday, 7/10 with Dr. Allyson SabalBerry.

## 2017-01-13 ENCOUNTER — Ambulatory Visit (INDEPENDENT_AMBULATORY_CARE_PROVIDER_SITE_OTHER): Payer: Medicare Other | Admitting: Cardiovascular Disease

## 2017-01-13 ENCOUNTER — Encounter: Payer: Self-pay | Admitting: Cardiovascular Disease

## 2017-01-13 VITALS — BP 168/82 | HR 68 | Ht 63.0 in | Wt 172.2 lb

## 2017-01-13 DIAGNOSIS — I739 Peripheral vascular disease, unspecified: Secondary | ICD-10-CM | POA: Diagnosis not present

## 2017-01-13 NOTE — Patient Instructions (Signed)
Medication Instructions: Your physician recommends that you continue on your current medications as directed. Please refer to the Current Medication list given to you today.   Testing/Procedures: Your physician has requested that you have a lower extremity arterial duplex. During this test, ultrasound is used to evaluate arterial blood flow in the legs. Allow one hour for this exam. There are no restrictions or special instructions.  Your physician has requested that you have an ankle brachial index (ABI). During this test an ultrasound and blood pressure cuff are used to evaluate the arteries that supply the arms and legs with blood. Allow thirty minutes for this exam. There are no restrictions or special instructions.   Follow-Up: Your physician wants you to follow-up in: 6 months with Dr. Berry. You will receive a reminder letter in the mail two months in advance. If you don't receive a letter, please call our office to schedule the follow-up appointment.  If you need a refill on your cardiac medications before your next appointment, please call your pharmacy.  

## 2017-01-13 NOTE — Progress Notes (Signed)
01/13/2017 Allah Reason   [birth year]  [id number]  Primary Physician Ralene Ok, MD Primary Cardiologist: [name] MD Roseanne Reno  HPI:  [name] is a 70 year oldMarried African American male, father of 4 who I last saw in the office 12/16/16., He has a history of CAD, s/p cath in 2008 after a stress test for CP showed ischemia. The cath, at that time, demonstrated an occluded PDA and collaterals with high grade OM2 disease and proximal segmental AV groove left circ disease. He underwent PCI of the AV groove left circ and OM2 with DES. He had reportedly done well until developing chest pain the day prior to arrival to the ED. The CP had been intermittent for over the course of 24 hours, before recurring and prompting the patient to seek evaluation. He described the pain as a sharp, stinging pain in his left chest with no SOB, nausea or diaphoresis. At the [facility] ER he was noted to have 4mm of ST elevation in V1 and V2 and a STEMI was called and he was transferred to [facility]. On arrival to [facility], his chest pain resolved. He was taken urgently to the cath lab. The procedure was performed by Dr. Allyson Sabal via the right femoral artery. It demonstrated a patent LAD stent and diffuse RCA disease. Medical therapy was recommended. He left the cath lab in stable condition. Post-cath, he was noted to have moderate hypertension. He was placed on Lopressor, HCTZ and lisinopril. He had mild improvement in BP. The patient saw Huey Bienenstock [facility] back in the office the Monday after discharge and his lisinopril was up titrated. Since that time he's had no chest pain and for some reason no longer is on an ACE inhibitor. He also has stopped smoking several months ago after smoking one pack per day for 55 years.   Since I saw him back in February he was evaluated in the emergency room at Dr. Elease Hashimoto for chest pain. His enzymes were negative and a Myoview stress test was low risk. He has had no recurrent  chest pain since that time. He does complain of some calf claudication bilaterally which is lifestyle limiting. He did have segmental pressures performed at [facility] 12/02/16 revealing a right ABI 0.54 and a left of 0.74. These did drop to .15 and .44 respectively with exercise. Based on this, I decided to proceed with duplex ultrasound and potential endovascular therapy.   Current Outpatient Prescriptions  Medication Sig Dispense Refill  . amLODipine-benazepril (LOTREL) 10-40 MG capsule Take 1 capsule by mouth daily.  2  . aspirin EC 81 MG EC tablet Take 1 tablet (81 mg total) by mouth daily.    Marland Kitchen atorvastatin (LIPITOR) 80 MG tablet Take 1 tablet by mouth daily.  3  . clopidogrel (PLAVIX) 75 MG tablet Take 75 mg by mouth daily.    Marland Kitchen glimepiride (AMARYL) 2 MG tablet Take 2 mg by mouth daily.    . hydrochlorothiazide (HYDRODIURIL) 25 MG tablet Take 25 mg by mouth daily.   5  . insulin glargine (LANTUS) 100 UNIT/ML injection Inject 36 Units into the skin every morning.     . magnesium oxide (MAG-OX) 400 MG tablet Take 400 mg by mouth daily.    . metFORMIN (GLUCOPHAGE) 500 MG tablet Take 500 mg by mouth 2 (two) times daily.  9  . metoprolol succinate (TOPROL XL) 25 MG 24 hr tablet Take 1 tablet (25 mg total) by mouth daily. 90  tablet 3  . nitroGLYCERIN (NITROSTAT) 0.4 MG SL tablet Place 1 tablet (0.4 mg total) under the tongue every 5 (five) minutes x 3 doses as needed for chest pain. 25 tablet 2  . pantoprazole (PROTONIX) 40 MG tablet Take 40 mg by mouth daily.    . potassium chloride (K-DUR) 10 MEQ tablet Take 10 mEq by mouth daily.  3   No current facility-administered medications for this visit.     No Known Allergies  Social History   Social History  . Marital status: Married    Spouse name: [name]  . Number of children: [name]  . Years of education: [name]   Occupational History  . [employer]     Delivers meds to hospitals and Pharmacies   Social History Main Topics  .  Smoking status: Former Smoker    Packs/day: 1.00    Years: 39.00    Types: Cigarettes    Quit date: 04/17/2014  . Smokeless tobacco: Never Used     Comment: every once in a while  . Alcohol use 8.4 oz/week    12 Cans of beer, 2 Shots of liquor per week     Comment: 02/17/2013 "6 beers/weekend day"  . Drug use: No  . Sexual activity: Yes   Other Topics Concern  . Not on file   Social History Narrative  . No narrative on file     Review of Systems: General: negative for chills, fever, night sweats or weight changes.  Cardiovascular: negative for chest pain, dyspnea on exertion, edema, orthopnea, palpitations, paroxysmal nocturnal dyspnea or shortness of breath Dermatological: negative for rash Respiratory: negative for cough or wheezing Urologic: negative for hematuria Abdominal: negative for nausea, vomiting, diarrhea, bright red blood per rectum, melena, or hematemesis Neurologic: negative for visual changes, syncope, or dizziness All other systems reviewed and are otherwise negative except as noted above.    Blood pressure (!) 168/82, pulse 68, height 5\' 3"  (1.6 m), weight 172 lb 3.2 oz (78.1 kg).  General appearance: alert and no distress Neck: no adenopathy, no carotid bruit, no JVD, supple, symmetrical, trachea midline and thyroid not enlarged, symmetric, no tenderness/mass/nodules Lungs: clear to auscultation bilaterally Heart: regular rate and rhythm, S1, S2 normal, no murmur, click, rub or gallop Extremities: extremities normal, atraumatic, no cyanosis or edema  EKG not performed today  ASSESSMENT AND PLAN:   PAD (peripheral artery disease) [facility](HCC) Mr. [name] returns today for follow-up of his lower extremity arterial Doppler sites segmental pressures [facility]. This revealed a right ABI 0.54 and a left upward 74. These did drop to 0.15 and 0.44 respectively with exercise. The Doppler suggested SFA disease. I am going to get duplex ultrasound to  further characterize the degree of PAD and then arrange for him to undergo angiography and potential intervention for lifestyle limiting claudication.      Runell GessJonathan J. Janeal Abadi MD FACP,FACC,FAHA, [facility] 01/13/2017 11:21 AM

## 2017-01-13 NOTE — Assessment & Plan Note (Signed)
Mr. Austin Mclaughlin returns today for follow-up of his lower extremity arterial Doppler sites segmental pressures [facility]. This revealed a right ABI 0.54 and a left upward 74. These did drop to 0.15 and 0.44 respectively with exercise. The Doppler suggested SFA disease. I am going to get duplex ultrasound to further characterize the degree of PAD and then arrange for him to undergo angiography and potential intervention for lifestyle limiting claudication.

## 2017-01-15 ENCOUNTER — Inpatient Hospital Stay (HOSPITAL_COMMUNITY): Admission: RE | Admit: 2017-01-15 | Payer: Medicare Other | Source: Ambulatory Visit

## 2017-01-26 ENCOUNTER — Ambulatory Visit (HOSPITAL_COMMUNITY)
Admission: RE | Admit: 2017-01-26 | Discharge: 2017-01-26 | Disposition: A | Discharge status: Home / Self Care | Payer: Medicare Other | Source: Ambulatory Visit | Attending: Cardiology | Admitting: Cardiology

## 2017-01-26 DIAGNOSIS — I739 Peripheral vascular disease, unspecified: Secondary | ICD-10-CM | POA: Insufficient documentation

## 2017-01-29 ENCOUNTER — Encounter (HOSPITAL_COMMUNITY): Payer: Medicare Other

## 2017-02-06 ENCOUNTER — Ambulatory Visit (INDEPENDENT_AMBULATORY_CARE_PROVIDER_SITE_OTHER): Payer: Medicare Other | Admitting: Cardiovascular Disease

## 2017-02-06 ENCOUNTER — Encounter: Payer: Self-pay | Admitting: Cardiovascular Disease

## 2017-02-06 VITALS — BP 148/68 | HR 67 | Ht 63.0 in | Wt 173.0 lb

## 2017-02-06 DIAGNOSIS — I10 Essential (primary) hypertension: Secondary | ICD-10-CM | POA: Diagnosis not present

## 2017-02-06 DIAGNOSIS — I739 Peripheral vascular disease, unspecified: Secondary | ICD-10-CM

## 2017-02-06 DIAGNOSIS — I2510 Atherosclerotic heart disease of native coronary artery without angina pectoris: Secondary | ICD-10-CM | POA: Diagnosis not present

## 2017-02-06 NOTE — Patient Instructions (Addendum)
   [facility] [address] [place] [address] Dept: [phone] Loc: [phone]  Austin Mclaughlin  02/06/2017  You are scheduled for a Peripheral Angiogram on Thursday, August 23 with Dr. Nanetta BattyJonathan Mclaughlin.  1. [facility] at [facility]: [address] [place], [place] [address] at 9:30 AM (two hours before your procedure to ensure your preparation). Free valet parking service is available.   Special note: Every effort is made to have your procedure done on time. Please understand that emergencies sometimes delay scheduled procedures.  2. Diet: Do not eat or drink anything after midnight prior to your procedure except sips of water to take medications.  3. Labs: You will need to have blood drawn on Monday, August 13 in our office. You do not need to be fasting.  4. Medication instructions in preparation for your procedure:  Do not take Glimepiride the morning of your procedure.  Stop taking, Glucophage (Metformin) on Wednesday, August 22.    Take only 18 units of insulin the night before your procedure. Do not take any insulin on the day of the procedure.  On the morning of your procedure, take your Plavix/Clopidogrel and any morning medicines NOT listed above.  You may use sips of water.  5. Plan for one night stay--bring personal belongings. 6. Bring a current list of your medications and current insurance cards. 7. You MUST have a responsible person to drive you home. 8. Someone MUST be with you the first 24 hours after you arrive home or your discharge will be delayed. 9. Please wear clothes that are easy to get on and off and wear slip-on shoes.  Thank you for allowing us to care for you!   -- [facility]   Schedule for 2 weeks post procedure:  Your physician has requested that you have a lower extremity  arterial duplex. During this test, ultrasound is used to evaluate arterial blood flow in the legs. Allow one hour for this exam. There are no restrictions or special instructions.  Your physician has requested that you have an ankle brachial index (ABI). During this test an ultrasound and blood pressure cuff are used to evaluate the arteries that supply the arms and legs with blood. Allow thirty minutes for this exam. There are no restrictions or special instructions.

## 2017-02-06 NOTE — Addendum Note (Signed)
Addended by: Evans LanceSTOVER, Ambera Fedele W on: 02/06/2017 03:08 PM   Modules accepted: Orders

## 2017-02-06 NOTE — Progress Notes (Signed)
02/06/2017 Austin Mclaughlin   06-14-1946  [id number]  Primary Physician Ralene OkMoreira, Roy, MD Primary Cardiologist: Runell GessJonathan J Billy Rocco MD Milagros LollFACP, FACC, [place]HA, [place]  HPI:  Austin Mclaughlin is a 71 y.o. male Married [place] male, father of 4 who I last saw in the office 01/13/17., He has a history of CAD, s/p cath in 2008 after a stress test for CP showed ischemia. The cath, at that time, demonstrated an occluded PDA and collaterals with high grade OM2 disease and proximal segmental AV groove left circ disease. He underwent PCI of the AV groove left circ and OM2 with DES. He had reportedly done well until developing chest pain the day prior to arrival to the ED. The CP had been intermittent for over the course of 24 hours, before recurring and prompting the patient to seek evaluation. He described the pain as a sharp, stinging pain in his left chest with no SOB, nausea or diaphoresis. [facility] he was noted to have 4mm of ST elevation in V1 and V2 and a STEMI was called and [facility], his chest pain resolved. He was taken urgently to the cath lab. The procedure was performed by Dr. Allyson SabalBerry via the right femoral artery. It demonstrated a patent LAD stent and diffuse RCA disease. Medical therapy was recommended. He left the cath lab in stable condition. Post-cath, he was noted to have moderate hypertension. He was placed on Lopressor, HCTZ and lisinopril. He had mild improvement in BP. The patient saw Huey BienenstockBrian Hager [facility] back in the office the Monday after discharge and his lisinopril was up titrated. Since that time he's had no chest pain and for some reason no longer is on an ACE inhibitor. He also has stopped smoking several months ago after smoking one pack per day for 55 years.   Since I saw him back in February he was evaluated in the emergency room at Dr. Elease HashimotoNahser for chest pain. His enzymes were negative and a Myoview stress test was low risk. He has had no recurrent chest  pain since that time. He does complain of some calf claudication bilaterally which is lifestyle limiting. He did have segmental pressures [facility] 12[facility]02/16 revealing a right ABI 0.54 and a left of 0.74. These did drop to .15 and .44 respectively with exercise. Based on this, I decided to proceed with duplex ultrasound and potential endovascular therapy. This was performed 01/26/17 revealing a right ABI 0.53 with a high-frequency signal of the origin of his right SFA and a left ABI 0.75 with an occluded mid left SFA.   Current Meds  Medication Sig  . amLODipine-benazepril (LOTREL) 10-40 MG capsule Take 1 capsule by mouth daily.  Marland Kitchen. aspirin EC 81 MG EC tablet Take 1 tablet (81 mg total) by mouth daily.  Marland Kitchen. atorvastatin (LIPITOR) 80 MG tablet Take 1 tablet by mouth daily.  . clopidogrel (PLAVIX) 75 MG tablet Take 75 mg by mouth daily.  Marland Kitchen. glimepiride (AMARYL) 2 MG tablet Take 2 mg by mouth daily.  . hydrochlorothiazide (HYDRODIURIL) 25 MG tablet Take 25 mg by mouth daily.   . insulin glargine (LANTUS) 100 UNIT/ML injection Inject 36 Units into the skin every morning.   . magnesium oxide (MAG-OX) 400 MG tablet Take 400 mg by mouth daily.  . metFORMIN (GLUCOPHAGE) 500 MG tablet Take 500 mg by mouth 2 (two) times daily.  . metoprolol succinate (TOPROL XL) 25 MG 24 hr  tablet Take 1 tablet (25 mg total) by mouth daily.  . nitroGLYCERIN (NITROSTAT) 0.4 MG SL tablet Place 1 tablet (0.4 mg total) under the tongue every 5 (five) minutes x 3 doses as needed for chest pain.  . pantoprazole (PROTONIX) 40 MG tablet Take 40 mg by mouth daily.  . potassium chloride (K-DUR) 10 MEQ tablet Take 10 mEq by mouth daily.     No Known Allergies  Social History   Social History  . Marital status: Married    Spouse name: [name]  . Number of children: [name]  . Years of education: [name]   Occupational History  . [employer]     Delivers meds to hospitals and Pharmacies   Social History Main  Topics  . Smoking status: Former Smoker    Packs/day: 1.00    Years: 39.00    Types: Cigarettes    Quit date: 04/17/2014  . Smokeless tobacco: Never Used     Comment: every once in a while  . Alcohol use 8.4 oz/week    12 Cans of beer, 2 Shots of liquor per week     Comment: 02/17/2013 "6 beers/weekend day"  . Drug use: No  . Sexual activity: Yes   Other Topics Concern  . Not on file   Social History Narrative  . No narrative on file     Review of Systems: General: negative for chills, fever, night sweats or weight changes.  Cardiovascular: negative for chest pain, dyspnea on exertion, edema, orthopnea, palpitations, paroxysmal nocturnal dyspnea or shortness of breath Dermatological: negative for rash Respiratory: negative for cough or wheezing Urologic: negative for hematuria Abdominal: negative for nausea, vomiting, diarrhea, bright red blood per rectum, melena, or hematemesis Neurologic: negative for visual changes, syncope, or dizziness All other systems reviewed and are otherwise negative except as noted above.    Blood pressure (!) 148/68, pulse 67, height 5\' 3"  (1.6 m), weight 173 lb (78.5 kg).  General appearance: alert and no distress Neck: no adenopathy, no carotid bruit, no JVD, supple, symmetrical, trachea midline and thyroid not enlarged, symmetric, no tenderness/mass/nodules Lungs: clear to auscultation bilaterally Heart: regular rate and rhythm, S1, S2 normal, no murmur, click, rub or gallop Extremities: extremities normal, atraumatic, no cyanosis or edema  EKG not performed today  ASSESSMENT AND PLAN:   PAD (peripheral artery disease) (HCC) Mr. [name] returns today for follow-up of his Doppler studies performed 01/26/17. He has a right ABI 0.53 with a high-frequency signal at the origin of his right SFA. His left ABI 0.75 with an occluded mid left SFA. His right leg is more symptomatic. He has lifestyle limiting claudication. He wishes to proceed with  angiography and potential intervention.      Runell GessJonathan J. Elica Almas MD FACP,FACC,FAHA, [facility] 02/06/2017 2:52 PM

## 2017-02-06 NOTE — Assessment & Plan Note (Signed)
Mr. Austin Mclaughlin returns today for follow-up of his Doppler studies performed 01/26/17. He has a right ABI 0.53 with a high-frequency signal at the origin of his right SFA. His left ABI 0.75 with an occluded mid left SFA. His right leg is more symptomatic. He has lifestyle limiting claudication. He wishes to proceed with angiography and potential intervention.

## 2017-02-16 LAB — BASIC METABOLIC PANEL
BUN/Creatinine Ratio: 16 (ref 10–24)
BUN: 17 mg/dL (ref 8–27)
CALCIUM: 10 mg/dL (ref 8.6–10.2)
CO2: 24 mmol/L (ref 20–29)
Chloride: 101 mmol/L (ref 96–106)
Creatinine, Ser: 1.09 mg/dL (ref 0.76–1.27)
GFR calc Af Amer: 78 mL/min/{1.73_m2} (ref >59)
GFR calc non Af Amer: 67 mL/min/{1.73_m2} (ref >59)
Glucose: 190 mg/dL — ABNORMAL HIGH (ref 65–99)
POTASSIUM: 4.4 mmol/L (ref 3.5–5.2)
Sodium: 141 mmol/L (ref 134–144)

## 2017-02-16 LAB — CBC WITH DIFFERENTIAL/PLATELET
BASOS ABS: 0 10*3/uL (ref 0.0–0.2)
Basos: 1 %
EOS (ABSOLUTE): 0.1 10*3/uL (ref 0.0–0.4)
Eos: 2 %
Hematocrit: 35.6 % — ABNORMAL LOW (ref 37.5–51.0)
Hemoglobin: 12 g/dL — ABNORMAL LOW (ref 13.0–17.7)
IMMATURE GRANS (ABS): 0 10*3/uL (ref 0.0–0.1)
IMMATURE GRANULOCYTES: 0 %
LYMPHS: 30 %
Lymphocytes Absolute: 2 10*3/uL (ref 0.7–3.1)
MCH: 29.6 pg (ref 26.6–33.0)
MCHC: 33.7 g/dL (ref 31.5–35.7)
MCV: 88 fL (ref 79–97)
MONOS ABS: 0.7 10*3/uL (ref 0.1–0.9)
Monocytes: 11 %
NEUTROS ABS: 3.9 10*3/uL (ref 1.4–7.0)
NEUTROS PCT: 56 %
PLATELETS: 302 10*3/uL (ref 150–379)
RBC: 4.05 x10E6/uL — ABNORMAL LOW (ref 4.14–5.80)
RDW: 12.9 % (ref 12.3–15.4)
WBC: 6.8 10*3/uL (ref 3.4–10.8)

## 2017-02-16 LAB — APTT: APTT: 26 s (ref 24–33)

## 2017-02-16 LAB — PROTIME-INR
INR: 1 (ref 0.8–1.2)
Prothrombin Time: 10.6 s (ref 9.1–12.0)

## 2017-02-16 LAB — TSH: TSH: 0.746 u[IU]/mL (ref 0.450–4.500)

## 2017-02-25 ENCOUNTER — Other Ambulatory Visit: Payer: Self-pay | Admitting: Cardiovascular Disease

## 2017-02-25 ENCOUNTER — Telehealth: Payer: Self-pay | Admitting: Cardiovascular Disease

## 2017-02-25 DIAGNOSIS — I739 Peripheral vascular disease, unspecified: Secondary | ICD-10-CM

## 2017-02-25 NOTE — Telephone Encounter (Signed)
Spoke to pt. Informed him of scheduled procedure time change due to Dr. Allyson Sabal having another procedure. Pt is now scheduled for 1:30 pm and is to arrive at the [facility] at 11:30 AM.  Pt verbalized understanding.

## 2017-02-26 ENCOUNTER — Encounter (HOSPITAL_COMMUNITY)
Admission: RE | Disposition: A | Discharge status: Home / Self Care | Payer: Self-pay | Source: Ambulatory Visit | Attending: Cardiovascular Disease

## 2017-02-26 ENCOUNTER — Ambulatory Visit (HOSPITAL_COMMUNITY)
Admission: RE | Admit: 2017-02-26 | Discharge: 2017-02-27 | Disposition: A | Discharge status: Home / Self Care | Payer: Medicare Other | Source: Ambulatory Visit | Attending: Cardiovascular Disease | Admitting: Cardiovascular Disease

## 2017-02-26 DIAGNOSIS — I7092 Chronic total occlusion of artery of the extremities: Secondary | ICD-10-CM | POA: Diagnosis not present

## 2017-02-26 DIAGNOSIS — Z7902 Long term (current) use of antithrombotics/antiplatelets: Secondary | ICD-10-CM | POA: Diagnosis not present

## 2017-02-26 DIAGNOSIS — E876 Hypokalemia: Secondary | ICD-10-CM | POA: Insufficient documentation

## 2017-02-26 DIAGNOSIS — Z79899 Other long term (current) drug therapy: Secondary | ICD-10-CM | POA: Insufficient documentation

## 2017-02-26 DIAGNOSIS — E785 Hyperlipidemia, unspecified: Secondary | ICD-10-CM | POA: Insufficient documentation

## 2017-02-26 DIAGNOSIS — Z87891 Personal history of nicotine dependence: Secondary | ICD-10-CM | POA: Diagnosis not present

## 2017-02-26 DIAGNOSIS — G8929 Other chronic pain: Secondary | ICD-10-CM | POA: Diagnosis not present

## 2017-02-26 DIAGNOSIS — I2510 Atherosclerotic heart disease of native coronary artery without angina pectoris: Secondary | ICD-10-CM | POA: Insufficient documentation

## 2017-02-26 DIAGNOSIS — I252 Old myocardial infarction: Secondary | ICD-10-CM | POA: Insufficient documentation

## 2017-02-26 DIAGNOSIS — I10 Essential (primary) hypertension: Secondary | ICD-10-CM | POA: Diagnosis not present

## 2017-02-26 DIAGNOSIS — I739 Peripheral vascular disease, unspecified: Secondary | ICD-10-CM | POA: Diagnosis present

## 2017-02-26 DIAGNOSIS — M549 Dorsalgia, unspecified: Secondary | ICD-10-CM | POA: Diagnosis not present

## 2017-02-26 DIAGNOSIS — Z794 Long term (current) use of insulin: Secondary | ICD-10-CM | POA: Diagnosis not present

## 2017-02-26 DIAGNOSIS — K219 Gastro-esophageal reflux disease without esophagitis: Secondary | ICD-10-CM | POA: Diagnosis not present

## 2017-02-26 DIAGNOSIS — I70213 Atherosclerosis of native arteries of extremities with intermittent claudication, bilateral legs: Secondary | ICD-10-CM | POA: Diagnosis not present

## 2017-02-26 DIAGNOSIS — Z7982 Long term (current) use of aspirin: Secondary | ICD-10-CM | POA: Insufficient documentation

## 2017-02-26 HISTORY — PX: PERIPHERAL VASCULAR INTERVENTION: CATH118257

## 2017-02-26 HISTORY — PX: PERIPHERAL VASCULAR ATHERECTOMY: CATH118256

## 2017-02-26 HISTORY — PX: LOWER EXTREMITY ANGIOGRAPHY: CATH118251

## 2017-02-26 LAB — POCT ACTIVATED CLOTTING TIME
ACTIVATED CLOTTING TIME: 175 s
Activated Clotting Time: 235 seconds
Activated Clotting Time: 263 seconds
Activated Clotting Time: 230 seconds
Activated Clotting Time: 186 seconds

## 2017-02-26 LAB — GLUCOSE, CAPILLARY
GLUCOSE-CAPILLARY: 130 mg/dL — AB (ref 65–99)
GLUCOSE-CAPILLARY: 281 mg/dL — AB (ref 65–99)
Glucose-Capillary: 83 mg/dL (ref 65–99)

## 2017-02-26 SURGERY — PERIPHERAL VASCULAR ATHERECTOMY
Anesthesia: LOCAL | Laterality: Right

## 2017-02-26 MED ORDER — AMLODIPINE BESYLATE 10 MG PO TABS
10.0000 mg | ORAL_TABLET | Freq: Every day | ORAL | Status: DC
  Administered 2017-02-26: 10 mg via ORAL
  Filled 2017-02-26: qty 1

## 2017-02-26 MED ORDER — ASPIRIN EC 81 MG PO TBEC: 81.0000 mg | DELAYED_RELEASE_TABLET | Freq: Every day | ORAL | Status: DC

## 2017-02-26 MED ORDER — IODIXANOL 320 MG/ML IV SOLN
INTRAVENOUS | Status: DC | PRN
  Administered 2017-02-26: 235 mL via INTRA_ARTERIAL

## 2017-02-26 MED ORDER — POTASSIUM CHLORIDE CRYS ER 20 MEQ PO TBCR
20.0000 meq | EXTENDED_RELEASE_TABLET | Freq: Every day | ORAL | Status: DC
  Administered 2017-02-26 – 2017-02-27 (×2): 20 meq via ORAL
  Filled 2017-02-26 (×3): qty 1

## 2017-02-26 MED ORDER — SODIUM CHLORIDE 0.9 % IV SOLN: INTRAVENOUS | Status: AC

## 2017-02-26 MED ORDER — CLOPIDOGREL BISULFATE 75 MG PO TABS: 75.0000 mg | ORAL_TABLET | Freq: Every day | ORAL | Status: DC

## 2017-02-26 MED ORDER — NITROGLYCERIN 1 MG/10 ML FOR IR/CATH LAB
INTRA_ARTERIAL | Status: DC | PRN
  Administered 2017-02-26: 200 ug via INTRA_ARTERIAL

## 2017-02-26 MED ORDER — ONDANSETRON HCL 4 MG/2ML IJ SOLN: 4.0000 mg | Freq: Four times a day (QID) | INTRAMUSCULAR | Status: DC | PRN

## 2017-02-26 MED ORDER — SODIUM CHLORIDE 0.9 % WEIGHT BASED INFUSION: 1.0000 mL/kg/h | INTRAVENOUS | Status: DC

## 2017-02-26 MED ORDER — SODIUM CHLORIDE 0.9% FLUSH
3.0000 mL | Freq: Two times a day (BID) | INTRAVENOUS | Status: DC
  Administered 2017-02-26: 22:00:00 3 mL via INTRAVENOUS

## 2017-02-26 MED ORDER — SODIUM CHLORIDE 0.9 % WEIGHT BASED INFUSION
3.0000 mL/kg/h | INTRAVENOUS | Status: DC
  Administered 2017-02-26: 3 mL/kg/h via INTRAVENOUS

## 2017-02-26 MED ORDER — INSULIN GLARGINE 100 UNIT/ML ~~LOC~~ SOLN
36.0000 [IU] | Freq: Every evening | SUBCUTANEOUS | Status: DC
  Administered 2017-02-26: 22:00:00 36 [IU] via SUBCUTANEOUS
  Filled 2017-02-26: qty 0.36

## 2017-02-26 MED ORDER — ACETAMINOPHEN 325 MG PO TABS: 650.0000 mg | ORAL_TABLET | ORAL | Status: DC | PRN

## 2017-02-26 MED ORDER — SODIUM CHLORIDE 0.9 % IV SOLN
250.0000 mL | INTRAVENOUS | Status: DC | PRN
Start: 2017-02-26 — End: 2017-02-27

## 2017-02-26 MED ORDER — SODIUM CHLORIDE 0.9% FLUSH: 3.0000 mL | INTRAVENOUS | Status: DC | PRN

## 2017-02-26 MED ORDER — HEPARIN (PORCINE) IN NACL 2-0.9 UNIT/ML-% IJ SOLN
INTRAMUSCULAR | Status: AC
  Filled 2017-02-26: qty 1000

## 2017-02-26 MED ORDER — ATORVASTATIN CALCIUM 40 MG PO TABS
40.0000 mg | ORAL_TABLET | Freq: Every day | ORAL | Status: DC
  Filled 2017-02-26: qty 1

## 2017-02-26 MED ORDER — ASPIRIN 81 MG PO TBEC: 81.0000 mg | DELAYED_RELEASE_TABLET | Freq: Every day | ORAL | Status: DC

## 2017-02-26 MED ORDER — HYDRALAZINE HCL 20 MG/ML IJ SOLN
INTRAMUSCULAR | Status: AC
  Filled 2017-02-26: qty 1

## 2017-02-26 MED ORDER — PANTOPRAZOLE SODIUM 40 MG PO TBEC
40.0000 mg | DELAYED_RELEASE_TABLET | Freq: Every day | ORAL | Status: DC
  Administered 2017-02-26 – 2017-02-27 (×2): 40 mg via ORAL
  Filled 2017-02-26 (×2): qty 1

## 2017-02-26 MED ORDER — CLOPIDOGREL BISULFATE 75 MG PO TABS
75.0000 mg | ORAL_TABLET | Freq: Every day | ORAL | Status: DC
  Administered 2017-02-27: 09:00:00 75 mg via ORAL
  Filled 2017-02-26: qty 1

## 2017-02-26 MED ORDER — MORPHINE SULFATE (PF) 4 MG/ML IV SOLN: 2.0000 mg | INTRAVENOUS | Status: DC | PRN

## 2017-02-26 MED ORDER — LIDOCAINE HCL (PF) 1 % IJ SOLN
INTRAMUSCULAR | Status: AC
  Filled 2017-02-26: qty 30

## 2017-02-26 MED ORDER — VERAPAMIL HCL 2.5 MG/ML IV SOLN
INTRAVENOUS | Status: AC
  Filled 2017-02-26: qty 2

## 2017-02-26 MED ORDER — LIDOCAINE HCL (PF) 1 % IJ SOLN
INTRAMUSCULAR | Status: DC | PRN
  Administered 2017-02-26: 20 mL via INTRADERMAL

## 2017-02-26 MED ORDER — ATORVASTATIN CALCIUM 40 MG PO TABS: 40.0000 mg | ORAL_TABLET | Freq: Every day | ORAL | Status: DC

## 2017-02-26 MED ORDER — ASPIRIN EC 81 MG PO TBEC
81.0000 mg | DELAYED_RELEASE_TABLET | Freq: Every day | ORAL | Status: DC
  Administered 2017-02-27: 81 mg via ORAL
  Filled 2017-02-26: qty 1

## 2017-02-26 MED ORDER — SODIUM CHLORIDE 0.9 % IV SOLN
INTRAVENOUS | Status: DC | PRN
  Administered 2017-02-26: 17:00:00 via SURGICAL_CAVITY

## 2017-02-26 MED ORDER — NITROGLYCERIN 0.4 MG SL SUBL
0.4000 mg | SUBLINGUAL_TABLET | SUBLINGUAL | Status: DC | PRN
Start: 2017-02-26 — End: 2017-02-27

## 2017-02-26 MED ORDER — HEPARIN SODIUM (PORCINE) 1000 UNIT/ML IJ SOLN
INTRAMUSCULAR | Status: AC
  Filled 2017-02-26: qty 1

## 2017-02-26 MED ORDER — HEPARIN (PORCINE) IN NACL 2-0.9 UNIT/ML-% IJ SOLN
INTRAMUSCULAR | Status: AC | PRN
  Administered 2017-02-26: 1000 mL

## 2017-02-26 MED ORDER — LABETALOL HCL 5 MG/ML IV SOLN: 10.0000 mg | INTRAVENOUS | Status: DC | PRN

## 2017-02-26 MED ORDER — HYDRALAZINE HCL 20 MG/ML IJ SOLN: 5.0000 mg | INTRAMUSCULAR | Status: DC | PRN

## 2017-02-26 MED ORDER — ASPIRIN 81 MG PO CHEW: 81.0000 mg | CHEWABLE_TABLET | ORAL | Status: DC

## 2017-02-26 MED ORDER — METOPROLOL SUCCINATE ER 25 MG PO TB24
25.0000 mg | ORAL_TABLET | Freq: Every day | ORAL | Status: DC
  Administered 2017-02-26 – 2017-02-27 (×2): 25 mg via ORAL
  Filled 2017-02-26 (×2): qty 1

## 2017-02-26 MED ORDER — HEPARIN SODIUM (PORCINE) 1000 UNIT/ML IJ SOLN
INTRAMUSCULAR | Status: DC | PRN
  Administered 2017-02-26: 2000 [IU] via INTRAVENOUS
  Administered 2017-02-26: 2500 [IU] via INTRAVENOUS
  Administered 2017-02-26: 8000 [IU] via INTRAVENOUS

## 2017-02-26 MED ORDER — SODIUM CHLORIDE 0.9% FLUSH
3.0000 mL | Freq: Two times a day (BID) | INTRAVENOUS | Status: DC
  Administered 2017-02-26: 3 mL via INTRAVENOUS

## 2017-02-26 MED ORDER — SODIUM CHLORIDE 0.9 % IV SOLN
INTRAVENOUS | Status: AC
  Administered 2017-02-26: 22:00:00 via INTRAVENOUS

## 2017-02-26 MED ORDER — SODIUM CHLORIDE 0.9 % IV SOLN: 250.0000 mL | INTRAVENOUS | Status: DC | PRN

## 2017-02-26 MED ORDER — HYDROCHLOROTHIAZIDE 25 MG PO TABS
25.0000 mg | ORAL_TABLET | Freq: Every day | ORAL | Status: DC
  Administered 2017-02-26 – 2017-02-27 (×2): 25 mg via ORAL
  Filled 2017-02-26 (×2): qty 1

## 2017-02-26 MED ORDER — ATORVASTATIN CALCIUM 80 MG PO TABS: 80.0000 mg | ORAL_TABLET | Freq: Every day | ORAL | Status: DC

## 2017-02-26 MED ORDER — BENAZEPRIL HCL 20 MG PO TABS
40.0000 mg | ORAL_TABLET | Freq: Every day | ORAL | Status: DC
  Administered 2017-02-26: 22:00:00 40 mg via ORAL
  Filled 2017-02-26: qty 2

## 2017-02-26 MED ORDER — HYDRALAZINE HCL 20 MG/ML IJ SOLN
5.0000 mg | INTRAMUSCULAR | Status: DC | PRN
  Administered 2017-02-26: 5 mg via INTRAVENOUS

## 2017-02-26 MED ORDER — AMLODIPINE BESY-BENAZEPRIL HCL 10-40 MG PO CAPS: 1.0000 | ORAL_CAPSULE | Freq: Every day | ORAL | Status: DC

## 2017-02-26 MED ORDER — ANGIOPLASTY BOOK
Freq: Once | Status: AC
  Administered 2017-02-27: 06:00:00
  Filled 2017-02-26: qty 1

## 2017-02-26 SURGICAL SUPPLY — 37 items
BALLN IN.PACT DCB 5X120 (BALLOONS) ×4
BALLN IN.PACT DCB 5X150 (BALLOONS) ×4
BALLN STERLING OTW 4X150X150 (BALLOONS) ×4
BALLOON STERLING OTW 4X150X150 (BALLOONS) ×3 IMPLANT
CATH ANGIO 5F PIGTAIL 65CM (CATHETERS) ×4 IMPLANT
CATH CROSS OVER TEMPO 5F (CATHETERS) ×4 IMPLANT
CATH HAWKONE LX EXTENDED TIP (CATHETERS) ×4 IMPLANT
CATH QUICKCROSS .018X135CM (MICROCATHETER) ×4 IMPLANT
CATH STRAIGHT 5FR 65CM (CATHETERS) ×4 IMPLANT
COVER PRB 48X5XTLSCP FOLD TPE (BAG) ×3 IMPLANT
COVER PROBE 5X48 (BAG) ×1
DCB IN.PACT 5X120 (BALLOONS) ×3 IMPLANT
DCB IN.PACT 5X150 (BALLOONS) ×3 IMPLANT
DEVICE CONTINUOUS FLUSH (MISCELLANEOUS) ×4 IMPLANT
DEVICE SPIDERFX EMB PROT 6MM (WIRE) ×4 IMPLANT
DIAMONDBACK SOLID OAS 2.0MM (CATHETERS) ×4
GUIDEWIRE ANGLED .035X150CM (WIRE) ×4 IMPLANT
GUIDEWIRE REGALIA .014X300CM (WIRE) ×4 IMPLANT
KIT ENCORE 26 ADVANTAGE (KITS) ×4 IMPLANT
KIT PV (KITS) ×4 IMPLANT
LUBRICANT VIPERSLIDE CORONARY (MISCELLANEOUS) ×4 IMPLANT
SHEATH BRITE TIP 7FR 35CM (SHEATH) ×4 IMPLANT
SHEATH HIGHFLEX ANSEL 7FR 55CM (SHEATH) ×4 IMPLANT
SHEATH PINNACLE 5F 10CM (SHEATH) ×4 IMPLANT
SHEATH PINNACLE 7F 10CM (SHEATH) ×4 IMPLANT
STENT VIABAHN 8X39X80 VBX (Permanent Stent) ×4 IMPLANT
STOPCOCK MORSE 400PSI 3WAY (MISCELLANEOUS) ×4 IMPLANT
SYRINGE MEDRAD AVANTA MACH 7 (SYRINGE) ×4 IMPLANT
SYSTEM DIMNDBCK SLD OAS 2.0MM (CATHETERS) ×3 IMPLANT
TAPE RADIOPAQUE TURBO (MISCELLANEOUS) ×4 IMPLANT
TRANSDUCER W/STOPCOCK (MISCELLANEOUS) ×4 IMPLANT
TRAY PV CATH (CUSTOM PROCEDURE TRAY) ×4 IMPLANT
TUBING CIL FLEX 10 FLL-RA (TUBING) ×4 IMPLANT
WIRE HITORQ VERSACORE ST 145CM (WIRE) ×4 IMPLANT
WIRE ROSEN-J .035X180CM (WIRE) ×4 IMPLANT
WIRE SPARTACORE .014X300CM (WIRE) ×4 IMPLANT
WIRE VIPER ADVANCE .017X335CM (WIRE) ×4 IMPLANT

## 2017-02-26 NOTE — Interval H&P Note (Signed)
History and Physical Interval Note:  02/26/2017 2:47 PM  Austin Mclaughlin  has presented today for surgery, with the diagnosis of pad  The various methods of treatment have been discussed with the patient and family. After consideration of risks, benefits and other options for treatment, the patient has consented to  Procedure(s): Lower Extremity Angiography ([name]) as a surgical intervention .  The patient's history has been reviewed, patient examined, no change in status, stable for surgery.  I have reviewed the patient's chart and labs.  Questions were answered to the patient's satisfaction.     Nanetta Batty

## 2017-02-26 NOTE — H&P (View-Only) (Signed)
   02/06/2017 Trev Schrum   02/12/1946  [id number]  Primary Physician Moreira, Roy, MD Primary Cardiologist: Kateleen Encarnacion J Kai Railsback MD FACP, FACC, FAHA, FSCAI  HPI:  Austin Mclaughlin is a 71 y.o. male Married African American male, father of 4 who I last saw in the office 01/13/17., He has a history of CAD, s/p cath in 2008 after a stress test for CP showed ischemia. The cath, at that time, demonstrated an occluded PDA and collaterals with high grade OM2 disease and proximal segmental AV groove left circ disease. He underwent PCI of the AV groove left circ and OM2 with DES. He had reportedly done well until developing chest pain the day prior to arrival to the ED. The CP had been intermittent for over the course of 24 hours, before recurring and prompting the patient to seek evaluation. He described the pain as a sharp, stinging pain in his left chest with no SOB, nausea or diaphoresis. At the [facility] ER he was noted to have 4mm of ST elevation in V1 and V2 and a STEMI was called and he was transferred to [facility]. On arrival to [place], his chest pain resolved. He was taken urgently to the cath lab. The procedure was performed by Dr. Mckenlee Mangham via the right femoral artery. It demonstrated a patent LAD stent and diffuse RCA disease. Medical therapy was recommended. He left the cath lab in stable condition. Post-cath, he was noted to have moderate hypertension. He was placed on Lopressor, HCTZ and lisinopril. He had mild improvement in BP. The patient saw Brian Hager PAC back in the office the Monday after discharge and his lisinopril was up titrated. Since that time he's had no chest pain and for some reason no longer is on an ACE inhibitor. He also has stopped smoking several months ago after smoking one pack per day for 55 years.   Since I saw him back in February he was evaluated in the emergency room at Dr. Nahser for chest pain. His enzymes were negative and a Myoview stress test was low risk. He has had no recurrent chest  pain since that time. He does complain of some calf claudication bilaterally which is lifestyle limiting. He did have segmental pressures performed at [facility] 12/02/16 revealing a right ABI 0.54 and a left of 0.74. These did drop to .15 and .44 respectively with exercise. Based on this, I decided to proceed with duplex ultrasound and potential endovascular therapy. This was performed 01/26/17 revealing a right ABI 0.53 with a high-frequency signal of the origin of his right SFA and a left ABI 0.75 with an occluded mid left SFA.   Current Meds  Medication Sig  . amLODipine-benazepril (LOTREL) 10-40 MG capsule Take 1 capsule by mouth daily.  . aspirin EC 81 MG EC tablet Take 1 tablet (81 mg total) by mouth daily.  . atorvastatin (LIPITOR) 80 MG tablet Take 1 tablet by mouth daily.  . clopidogrel (PLAVIX) 75 MG tablet Take 75 mg by mouth daily.  . glimepiride (AMARYL) 2 MG tablet Take 2 mg by mouth daily.  . hydrochlorothiazide (HYDRODIURIL) 25 MG tablet Take 25 mg by mouth daily.   . insulin glargine (LANTUS) 100 UNIT/ML injection Inject 36 Units into the skin every morning.   . magnesium oxide (MAG-OX) 400 MG tablet Take 400 mg by mouth daily.  . metFORMIN (GLUCOPHAGE) 500 MG tablet Take 500 mg by mouth 2 (two) times daily.  . metoprolol succinate (TOPROL XL) 25 MG 24 hr   tablet Take 1 tablet (25 mg total) by mouth daily.  . nitroGLYCERIN (NITROSTAT) 0.4 MG SL tablet Place 1 tablet (0.4 mg total) under the tongue every 5 (five) minutes x 3 doses as needed for chest pain.  . pantoprazole (PROTONIX) 40 MG tablet Take 40 mg by mouth daily.  . potassium chloride (K-DUR) 10 MEQ tablet Take 10 mEq by mouth daily.     No Known Allergies  Social History   Social History  . Marital status: Married    Spouse name: [name]  . Number of children: [name]  . Years of education: [name]   Occupational History  . [employer]     Delivers meds to hospitals and Pharmacies   Social History Main  Topics  . Smoking status: Former Smoker    Packs/day: 1.00    Years: 39.00    Types: Cigarettes    Quit date: 04/17/2014  . Smokeless tobacco: Never Used     Comment: every once in a while  . Alcohol use 8.4 oz/week    12 Cans of beer, 2 Shots of liquor per week     Comment: 02/17/2013 "6 beers/weekend day"  . Drug use: No  . Sexual activity: Yes   Other Topics Concern  . Not on file   Social History Narrative  . No narrative on file     Review of Systems: General: negative for chills, fever, night sweats or weight changes.  Cardiovascular: negative for chest pain, dyspnea on exertion, edema, orthopnea, palpitations, paroxysmal nocturnal dyspnea or shortness of breath Dermatological: negative for rash Respiratory: negative for cough or wheezing Urologic: negative for hematuria Abdominal: negative for nausea, vomiting, diarrhea, bright red blood per rectum, melena, or hematemesis Neurologic: negative for visual changes, syncope, or dizziness All other systems reviewed and are otherwise negative except as noted above.    Blood pressure (!) 148/68, pulse 67, height 5\' 3"  (1.6 m), weight 173 lb (78.5 kg).  General appearance: alert and no distress Neck: no adenopathy, no carotid bruit, no JVD, supple, symmetrical, trachea midline and thyroid not enlarged, symmetric, no tenderness/mass/nodules Lungs: clear to auscultation bilaterally Heart: regular rate and rhythm, S1, S2 normal, no murmur, click, rub or gallop Extremities: extremities normal, atraumatic, no cyanosis or edema  EKG not performed today  ASSESSMENT AND PLAN:   PAD (peripheral artery disease) (HCC) [name] returns today for follow-up of his Doppler studies performed 01/26/17. He has a right ABI 0.53 with a high-frequency signal at the origin of his right SFA. His left ABI 0.75 with an occluded mid left SFA. His right leg is more symptomatic. He has lifestyle limiting claudication. He wishes to proceed with  angiography and potential intervention.      [name] MD FACP,FACC,FAHA, [facility] 02/06/2017 2:52 PM

## 2017-02-26 NOTE — Interval H&P Note (Signed)
History and Physical Interval Note:  02/26/2017 1:37 PM  Austin Mclaughlin  has presented today for surgery, with the diagnosis of pad  The various methods of treatment have been discussed with the patient and family. After consideration of risks, benefits and other options for treatment, the patient has consented to  Procedure(s): Lower Extremity Angiography ([name]) as a surgical intervention .  The patient's history has been reviewed, patient examined, no change in status, stable for surgery.  I have reviewed the patient's chart and labs.  Questions were answered to the patient's satisfaction.     Nanetta Batty

## 2017-02-26 NOTE — Progress Notes (Signed)
32fr sheath aspirated and removed from LFA, manual pressure applied for 20 minutes. Groin level 0 , no s+s of hematoma. Tegaderm dressing applied, bedrest instructions given.  Bilateral pt pulses present with doppler. Right dp present with doppler. Left dp absent.  Bedrest begins at 19:20:00

## 2017-02-27 ENCOUNTER — Encounter (HOSPITAL_COMMUNITY): Payer: Self-pay | Admitting: Cardiovascular Disease

## 2017-02-27 DIAGNOSIS — I70213 Atherosclerosis of native arteries of extremities with intermittent claudication, bilateral legs: Secondary | ICD-10-CM | POA: Diagnosis not present

## 2017-02-27 DIAGNOSIS — I739 Peripheral vascular disease, unspecified: Secondary | ICD-10-CM

## 2017-02-27 LAB — CBC
HCT: 33.8 % — ABNORMAL LOW (ref 39.0–52.0)
Hemoglobin: 11.4 g/dL — ABNORMAL LOW (ref 13.0–17.0)
MCH: 29.6 pg (ref 26.0–34.0)
MCHC: 33.7 g/dL (ref 30.0–36.0)
MCV: 87.8 fL (ref 78.0–100.0)
Platelets: 273 10*3/uL (ref 150–400)
RBC: 3.85 MIL/uL — AB (ref 4.22–5.81)
RDW: 13.1 % (ref 11.5–15.5)
WBC: 6.9 10*3/uL (ref 4.0–10.5)

## 2017-02-27 LAB — BASIC METABOLIC PANEL
Anion gap: 10 (ref 5–15)
BUN: 9 mg/dL (ref 6–20)
CALCIUM: 9 mg/dL (ref 8.9–10.3)
CO2: 25 mmol/L (ref 22–32)
Chloride: 104 mmol/L (ref 101–111)
Creatinine, Ser: 0.86 mg/dL (ref 0.61–1.24)
GFR calc non Af Amer: >60 mL/min (ref >60)
Glucose, Bld: 155 mg/dL — ABNORMAL HIGH (ref 65–99)
Potassium: 3.2 mmol/L — ABNORMAL LOW (ref 3.5–5.1)
Sodium: 139 mmol/L (ref 135–145)

## 2017-02-27 LAB — GLUCOSE, CAPILLARY: Glucose-Capillary: 138 mg/dL — ABNORMAL HIGH (ref 65–99)

## 2017-02-27 MED ORDER — POTASSIUM CHLORIDE CRYS ER 20 MEQ PO TBCR
40.0000 meq | EXTENDED_RELEASE_TABLET | Freq: Once | ORAL | Status: AC
  Administered 2017-02-27: 40 meq via ORAL
  Filled 2017-02-27: qty 2

## 2017-02-27 NOTE — Progress Notes (Signed)
Progress Note  Patient Name: Austin Mclaughlin Date of Encounter: 02/27/2017  Primary Cardiologist: Dr. Allyson Sabal   Subjective   Feeling well. No chest pain, sob or palpitations.   Inpatient Medications    Scheduled Meds: . amLODipine  10 mg Oral q1800   And  . benazepril  40 mg Oral q1800  . aspirin EC  81 mg Oral Daily  . atorvastatin  40 mg Oral q1800  . clopidogrel  75 mg Oral Q breakfast  . hydrochlorothiazide  25 mg Oral Daily  . insulin glargine  36 Units Subcutaneous QPM  . metoprolol succinate  25 mg Oral Daily  . pantoprazole  40 mg Oral Daily  . potassium chloride SA  20 mEq Oral Daily  . sodium chloride flush  3 mL Intravenous Q12H   Continuous Infusions: . sodium chloride    . sodium chloride    . sodium chloride     PRN Meds: sodium chloride, sodium chloride, acetaminophen, hydrALAZINE, labetalol, morphine injection, nitroGLYCERIN, ondansetron (ZOFRAN) IV, sodium chloride flush   Vital Signs    Vitals:   02/27/17 0000 02/27/17 0110 02/27/17 0200 02/27/17 0300  BP: (!) 140/52 (!) 135/55 133/63 (!) 132/54  Pulse: 68 69 73 70  Resp: 18 18 14 19   Temp:  97.6 F (36.4 C)    TempSrc:  Axillary    SpO2: 99% 98% 97% 97%  Weight:  176 lb 9.4 oz (80.1 kg)    Height:        Intake/Output Summary (Last 24 hours) at 02/27/17 0800 Last data filed at 02/26/17 2359  Gross per 24 hour  Intake                0 ml  Output             1400 ml  Net            -1400 ml   Filed Weights   02/26/17 1123 02/27/17 0110  Weight: 174 lb (78.9 kg) 176 lb 9.4 oz (80.1 kg)    Telemetry    SR with rate PVCs- Personally Reviewed  ECG    None today   Physical Exam   GEN: No acute distress.   Neck: No JVD Cardiac: RRR, no murmurs, rubs, or gallops. L groin cath site without hematoma.  Respiratory: Clear to auscultation bilaterally. GI: Soft, nontender, non-distended  MS: No edema; No deformity. Neuro:  Nonfocal  Psych: Normal affect   Labs    Chemistry Recent  Labs Lab 02/27/17 0535  NA 139  K 3.2*  CL 104  CO2 25  GLUCOSE 155*  BUN 9  CREATININE 0.86  CALCIUM 9.0  GFRNONAA >60  GFRAA >60  ANIONGAP 10     Hematology Recent Labs Lab 02/27/17 0535  WBC 6.9  RBC 3.85*  HGB 11.4*  HCT 33.8*  MCV 87.8  MCH 29.6  MCHC 33.7  RDW 13.1  PLT 273    Cardiac EnzymesNo results for input(s): TROPONINI in the last 168 hours. No results for input(s): TROPIPOC in the last 168 hours.   BNPNo results for input(s): BNP, PROBNP in the last 168 hours.   DDimer No results for input(s): DDIMER in the last 168 hours.   Radiology    No results found.  Cardiac Studies   Lower Extremity Angiography  PERIPHERAL VASCULAR ATHERECTOMY  PERIPHERAL VASCULAR INTERVENTION  Procedures Performed:            1. Abdominal aortic aortogram, bilateral iliac angiogram, bifemoral  runoff            2. Contralateral access (second order catheter placement).            3. Hawk 1 directional atherectomy using distal protection and drug-eluting to angioplasty of the proximal and mid right SFA            4. 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 We will proceed with directional atherectomy and drug-eluting balloon after plasty of the proximal and mid right SFA followed by diamondback orbital rotational atherectomy and covered stenting of the left common iliac artery Final Impression: Successful Hawk 1 directional atherectomy followed by drug-eluting balloon edge plasty of the proximal right SFA and mid right SFA using distal protection as well as successful diamondback orbital rotational atherectomy and covered stenting of the origin of the left common iliac artery. The patient has residual occluded left SFA which will be treated in a staged fashion. The sheath  will be removed once the ACT falls below 07/07/1968 and pressure held. Patient will be hydrated overnight and discharged home in the morning on dual antibiotic therapy. I will see him back in the [facility] office the week of September 10 to discuss timing of his staged left SFA intervention. He left the lab in stable condition.  Patient Profile     71 y.o. male with PHM of CAD with MI in 2008 s/p DES x 2 to Cx and OM, HTN, DM, GERD and chronic back pain presented for outpatient PV angiogram and possible intervention.    He does complain of some calf claudication bilaterally which is lifestyle limiting. Recent study 01/26/17 revealing a right ABI 0.53 with a high-frequency signal of the origin of his right SFA and a left ABI 0.75 with an occluded mid left SFA. His right leg is more symptomatic.  Assessment & Plan    1. PAD  - S/p  successful Hawk 1 directional atherectomy using distal protection and drug-eluting to angioplasty of the proximal and mid right SFA. Diamondback orbital rotational atherectomy, covered stenting of the left common iliac artery. Plan for staged intervention of occluded left SFA.  - Continue ASA, Plavix and statin.   2. Hypokalemia - additional Kdur x 1.   3. HLD - 12/05/2016: Cholesterol 116; HDL 36; LDL Cholesterol 71; Triglycerides 43; VLDL 9  - LDL very close to goal. Continue Lipitor 40mg  qd. He will benefits from high dose of statin. Will defer to Dr. Allyson Sabal.   F/u with Dr. Allyson Sabal to discuss timing of staged left SFA intervention.   Signed, Manson Passey, PA  02/27/2017, 8:00 AM    Patient seen, examined. Available data reviewed. Agree with findings, assessment, and plan as outlined by Austin Aus, PA-C. On my exam the patient is sitting up in the chair, comfortable. His left groin site is clear. There is no lower extremity edema in both feet are warm. The heart is regular rate and rhythm with a soft systolic murmur at the left sternal border. Lung  fields are clear. The patient's interventional procedure note was reviewed from yesterday as he underwent atherectomy and stenting of the left common iliac artery and atherectomy and angioplasty of the right SFA. He will follow-up as outlined above with consideration of treatment of an occluded left SFA. He understands the need to take aspirin and clopidogrel.  Tonny Bollman, M.D. 02/27/2017 8:55 AM

## 2017-02-27 NOTE — Discharge Summary (Signed)
Discharge Summary    Patient ID: Austin Mclaughlin,  MRN: [id number], DOB/AGE: 03-30-1946 71 y.o.  Admit date: 02/26/2017 Discharge date: 02/27/2017  Primary Care Provider: Ralene Ok Primary Cardiologist: Dr. Allyson Sabal   Discharge Diagnoses    Active Problems:   PAD (peripheral artery disease) (HCC)   Claudication in peripheral vascular disease (HCC)   Hypokalemia   HLD  Allergies No Known Allergies  Diagnostic Studies/Procedures    Lower Extremity Angiography  PERIPHERAL VASCULAR ATHERECTOMY  PERIPHERAL VASCULAR INTERVENTION  Procedures Performed: 1. Abdominal aortic aortogram, bilateral iliac angiogram, bifemoral runoff 2. Contralateral access (second order catheter placement). 3. Hawk 1 directional atherectomy using distal protection and drug-eluting to angioplasty of the proximal and mid right SFA 4. Diamondback orbital rotational atherectomy, covered stenting of the left common iliac artery IMPRESSION:Mr. 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 We will proceed with directional atherectomy and drug-eluting balloon after plasty of the proximal and mid right SFA followed by diamondback orbital rotational atherectomy and covered stenting of the left common iliac artery Final Impression:Successful Hawk 1 directional atherectomy followed by drug-eluting balloon edge plasty of the proximal right SFA and mid right SFA using distal protection as well as successful diamondback orbital rotational atherectomy and covered stenting of the origin of the left common iliac artery. The patient has residual occluded left SFA which will be treated in a staged fashion. The sheath will be removed once the ACT falls below 07/07/1968 and pressure held. Patient will be hydrated overnight and discharged home in the morning on dual antibiotic therapy. I will see  him back in the [facility] office the week of September 10 to discuss timing of his staged left SFA intervention. He left the lab in stable condition.    History of Present Illness     71 y.o. male with PHM of CAD with MI in 2008 s/p DES x 2 to Cx and OM, HTN, DM, GERD and chronic back pain presented for outpatient PV angiogram and possible intervention.   He does complain of some calf claudication bilaterally which is lifestyle limiting. Recent study 01/26/17 revealing a right ABI 0.53 with a high-frequency signal of the origin of his right SFA and a left ABI 0.75 with an occluded mid left SFA. His right leg is more symptomatic.  Hospital Course     Consultants: None   1. PAD  - S/p successful Hawk 1 directional atherectomy using distal protection and drug-eluting to angioplasty of the proximal and mid right SFA. Diamondback orbital rotational atherectomy, covered stenting of the left common iliac artery. Plan for staged intervention of occluded left SFA.  - Continue ASA, Plavix and statin.   2. Hypokalemia - Supplement given   3. HLD - 12/05/2016: Cholesterol 116; HDL 36; LDL Cholesterol 71; Triglycerides 43; VLDL 9  - Continue statin   The patient has been seen by Dr. Excell Seltzer today and deemed ready for discharge home. All follow-up appointments have been scheduled. Discharge medications are listed below.    Discharge Vitals Blood pressure (!) 144/60, pulse 74, temperature 98.1 F (36.7 C), temperature source Oral, resp. rate 17, height 5\' 5"  (1.651 m), weight 176 lb 9.4 oz (80.1 kg), SpO2 98 %.  Filed Weights   02/26/17 1123 02/27/17 0110  Weight: 174 lb (78.9 kg) 176 lb 9.4 oz (80.1 kg)    Labs & Radiologic Studies     CBC  Recent Labs  02/27/17 0535  WBC 6.9  HGB 11.4*  HCT 33.8*  MCV 87.8  PLT 273   Basic Metabolic Panel  Recent Labs  02/27/17 0535  NA 139  K 3.2*  CL 104  CO2 25  GLUCOSE 155*  BUN 9  CREATININE 0.86  CALCIUM 9.0      Disposition   Pt is being discharged home today in good condition.  Follow-up Plans & Appointments    Follow-up Information    [name], MD. Go on 03/24/2017.   Specialties:  Cardiology, Radiology Why:  @ 10am for discussed staged PCI Contact information: [address] [place] [address] [phone]          Discharge Instructions    Diet - low sodium heart healthy    Complete by:  As directed    Discharge instructions    Complete by:  As directed    No driving for 48 hours. No lifting over 5 lbs for 1 week. No sexual activity for 1 week.  Keep procedure site clean & dry. If you notice increased pain, swelling, bleeding or pus, call/return!  You may shower, but no soaking baths/hot tubs/pools for 1 week.   Hold metformin today and tomorrow. Resume Sunday.   Increase activity slowly    Complete by:  As directed       Discharge Medications   Current Discharge Medication List    CONTINUE these medications which have NOT CHANGED   Details  amLODipine-benazepril (LOTREL) 10-40 MG capsule Take 1 capsule by mouth daily. Refills: 2    aspirin EC 81 MG EC tablet Take 1 tablet (81 mg total) by mouth daily.    atorvastatin (LIPITOR) 80 MG tablet Take 80 mg by mouth daily.  Refills: 3    clopidogrel (PLAVIX) 75 MG tablet Take 75 mg by mouth daily.    hydrochlorothiazide (HYDRODIURIL) 25 MG tablet Take 25 mg by mouth daily.  Refills: 5    insulin glargine (LANTUS) 100 UNIT/ML injection Inject 36 Units into the skin every evening.     magnesium oxide (MAG-OX) 400 MG tablet Take 400 mg by mouth daily.    metFORMIN (GLUCOPHAGE) 1000 MG tablet Take 1,000 mg by mouth 2 (two) times daily.    metoprolol succinate (TOPROL XL) 25 MG 24 hr tablet Take 1 tablet (25 mg total) by mouth daily. Qty: 90 tablet, Refills: 3    nitroGLYCERIN (NITROSTAT) 0.4 MG SL tablet Place 1 tablet (0.4 mg total) under the tongue every 5 (five) minutes x 3 doses as  needed for chest pain. Qty: 25 tablet, Refills: 2    pantoprazole (PROTONIX) 40 MG tablet Take 40 mg by mouth daily.    Potassium Chloride ER 20 MEQ TBCR Take 20 mEq by mouth daily. Refills: 2           Outstanding Labs/Studies   None  Duration of Discharge Encounter   Greater than 73 minutes including physician time.  Signed, Chizaram Latino PA-C 02/27/2017, 9:24 AM

## 2017-02-27 NOTE — Care Management Note (Signed)
Case Management Note  Patient Details  Name: Austin Mclaughlin MRN: [id number] Date of Birth: [birth year]  Subjective/Objective:   From home, s/p pv intervention, will be on plavix. For dc today, no needs.                  Action/Plan:   Expected Discharge Date:  02/27/17               Expected Discharge Plan:  Home/Self Care  In-House Referral:     Discharge planning Services  CM Consult  Post Acute Care Choice:    Choice offered to:     DME Arranged:    DME Agency:     HH Arranged:    HH Agency:     Status of Service:  Completed, signed off  If discussed at [employer] of Stay Meetings, dates discussed:    Additional Comments:  Leone Haven, RN 02/27/2017, 9:19 AM

## 2017-03-06 ENCOUNTER — Other Ambulatory Visit: Payer: Self-pay | Admitting: Cardiovascular Disease

## 2017-03-06 DIAGNOSIS — I739 Peripheral vascular disease, unspecified: Secondary | ICD-10-CM

## 2017-03-12 ENCOUNTER — Ambulatory Visit (HOSPITAL_COMMUNITY)
Admission: RE | Admit: 2017-03-12 | Discharge: 2017-03-12 | Disposition: A | Discharge status: Home / Self Care | Payer: Medicare Other | Source: Ambulatory Visit | Attending: Internal Medicine | Admitting: Internal Medicine

## 2017-03-12 DIAGNOSIS — I739 Peripheral vascular disease, unspecified: Secondary | ICD-10-CM | POA: Insufficient documentation

## 2017-03-12 DIAGNOSIS — I2510 Atherosclerotic heart disease of native coronary artery without angina pectoris: Secondary | ICD-10-CM | POA: Diagnosis not present

## 2017-03-12 DIAGNOSIS — Z87891 Personal history of nicotine dependence: Secondary | ICD-10-CM | POA: Diagnosis not present

## 2017-03-12 DIAGNOSIS — Z95828 Presence of other vascular implants and grafts: Secondary | ICD-10-CM | POA: Diagnosis not present

## 2017-03-12 DIAGNOSIS — E785 Hyperlipidemia, unspecified: Secondary | ICD-10-CM | POA: Diagnosis not present

## 2017-03-12 DIAGNOSIS — I10 Essential (primary) hypertension: Secondary | ICD-10-CM | POA: Diagnosis not present

## 2017-03-12 DIAGNOSIS — I708 Atherosclerosis of other arteries: Secondary | ICD-10-CM | POA: Diagnosis not present

## 2017-03-24 ENCOUNTER — Ambulatory Visit: Payer: Medicare Other | Admitting: Cardiovascular Disease

## 2017-04-23 ENCOUNTER — Encounter: Payer: Self-pay | Admitting: Cardiovascular Disease

## 2017-04-23 ENCOUNTER — Ambulatory Visit (INDEPENDENT_AMBULATORY_CARE_PROVIDER_SITE_OTHER): Payer: Medicare Other | Admitting: Cardiovascular Disease

## 2017-04-23 VITALS — BP 160/64 | HR 66 | Ht 64.0 in | Wt 179.0 lb

## 2017-04-23 DIAGNOSIS — Z720 Tobacco use: Secondary | ICD-10-CM | POA: Diagnosis not present

## 2017-04-23 DIAGNOSIS — I2510 Atherosclerotic heart disease of native coronary artery without angina pectoris: Secondary | ICD-10-CM | POA: Diagnosis not present

## 2017-04-23 DIAGNOSIS — E7800 Pure hypercholesterolemia, unspecified: Secondary | ICD-10-CM | POA: Diagnosis not present

## 2017-04-23 DIAGNOSIS — I739 Peripheral vascular disease, unspecified: Secondary | ICD-10-CM

## 2017-04-23 NOTE — Assessment & Plan Note (Signed)
History of essential hypertension blood pressure measurements at 160/64. He is on amlodipine, benazepril, hydrochlorothiazide and metoprolol. Continue current meds at current dosing

## 2017-04-23 NOTE — Progress Notes (Signed)
04/23/2017 Austin Mclaughlin   21-Nov-1945  [id number]  Primary Physician Ralene OkMoreira, Roy, MD Primary Cardiologist: Runell GessJonathan J Sihaam Chrobak MD Milagros LollFACP, FACC, [place], [place]  HPI:  Austin Spitzvon Topel is a 71 y.o. male married, father of 4 who I last saw in the office 02/06/17.., He has a history of CAD, s/p cath in 2008 after a stress test for CP showed ischemia. The cath, at that time, demonstrated an occluded PDA and collaterals with high grade OM2 disease and proximal segmental AV groove left circ disease. He underwent PCI of the AV groove left circ and OM2 with DES. He had reportedly done well until developing chest pain the day prior to arrival to the ED. The CP had been intermittent for over the course of 24 hours, before recurring and prompting the patient to seek evaluation. He described the pain as a sharp, stinging pain in his left chest with no SOB, nausea or diaphoresis. [facility] he was noted to have 4mm of ST elevation in V1 and V2 and a STEMI was called and [facility], his chest pain resolved. He was taken urgently to the cath lab. The procedure was performed by Dr. Allyson SabalBerry via the right femoral artery. It demonstrated a patent LAD stent and diffuse RCA disease. Medical therapy was recommended. He left the cath lab in stable condition. Post-cath, he was noted to have moderate hypertension. He was placed on Lopressor, HCTZ and lisinopril. He had mild improvement in BP. The patient saw [facility] in the office the Monday after discharge and his lisinopril was up titrated. Since that time he's had no chest pain and for some reason no longer is on an ACE inhibitor. He also has stopped smoking several months ago after smoking one pack per day for 55 years.   Since I saw him back in February he was evaluated in the emergency room at Dr. Elease HashimotoNahser for chest pain. His enzymes were negative and a Myoview stress test was low risk. He has had no recurrent chest pain since that time.  He does complain of some calf claudication bilaterally which is lifestyle limiting. He did have segmental pressures [facility] 12/02/16 revealing a right ABI 0.54 and a left of 0.74. These did drop to .15 and .44 respectively with exercise. Based on this, I decided to proceed with duplex ultrasound and potential endovascular therapy. This was performed 01/26/17 revealing a right ABI 0.53 with a high-frequency signal of the origin of his right SFA and a left ABI 0.75 with an occluded mid left SFA.   I performed angiography and intervention on him 02/26/17. I performed a diamond echo orbital facial atherectomy followed by V BX cover stenting of a calcified high-grade left common iliac artery stenosis in addition to [facility] one directional atherectomy and drug-eluting glandular plasty of his entire right SFA. His claudication resolved and his Dopplers improved.   Current Meds  Medication Sig  . amLODipine-benazepril (LOTREL) 10-40 MG capsule Take 1 capsule by mouth daily.  Marland Kitchen. aspirin EC 81 MG EC tablet Take 1 tablet (81 mg total) by mouth daily.  Marland Kitchen. atorvastatin (LIPITOR) 80 MG tablet Take 80 mg by mouth daily.   . clopidogrel (PLAVIX) 75 MG tablet Take 75 mg by mouth daily.  . hydrochlorothiazide (HYDRODIURIL) 25 MG tablet Take 25 mg by mouth daily.   . insulin glargine (LANTUS) 100 UNIT/ML injection Inject 36 Units into the skin every evening.   .Marland Kitchen  magnesium oxide (MAG-OX) 400 MG tablet Take 400 mg by mouth daily.  . metFORMIN (GLUCOPHAGE) 1000 MG tablet Take 1,000 mg by mouth 2 (two) times daily.  . metoprolol succinate (TOPROL XL) 25 MG 24 hr tablet Take 1 tablet (25 mg total) by mouth daily.  . nitroGLYCERIN (NITROSTAT) 0.4 MG SL tablet Place 1 tablet (0.4 mg total) under the tongue every 5 (five) minutes x 3 doses as needed for chest pain.  . pantoprazole (PROTONIX) 40 MG tablet Take 40 mg by mouth daily.  . Potassium Chloride ER 20 MEQ TBCR Take 20 mEq by mouth daily.     No Known  Allergies  Social History   Social History  . Marital status: Married    Spouse name: [name]  . Number of children: [name]  . Years of education: [name]   Occupational History  . [employer]     Delivers meds to hospitals and Pharmacies   Social History Main Topics  . Smoking status: Former Smoker    Packs/day: 1.00    Years: 39.00    Types: Cigarettes    Quit date: 04/17/2014  . Smokeless tobacco: Never Used     Comment: every once in a while  . Alcohol use 8.4 oz/week    12 Cans of beer, 2 Shots of liquor per week     Comment: 02/17/2013 "6 beers/weekend day"  . Drug use: No  . Sexual activity: Yes   Other Topics Concern  . Not on file   Social History Narrative  . No narrative on file     Review of Systems: General: negative for chills, fever, night sweats or weight changes.  Cardiovascular: negative for chest pain, dyspnea on exertion, edema, orthopnea, palpitations, paroxysmal nocturnal dyspnea or shortness of breath Dermatological: negative for rash Respiratory: negative for cough or wheezing Urologic: negative for hematuria Abdominal: negative for nausea, vomiting, diarrhea, bright red blood per rectum, melena, or hematemesis Neurologic: negative for visual changes, syncope, or dizziness All other systems reviewed and are otherwise negative except as noted above.    Blood pressure (!) 160/64, pulse 66, height 5\' 4"  (1.626 m), weight 179 lb (81.2 kg).  General appearance: alert and no distress Neck: no adenopathy, no JVD, supple, symmetrical, trachea midline, thyroid not enlarged, symmetric, no tenderness/mass/nodules and Soft left carotid bruit Lungs: clear to auscultation bilaterally Heart: regular rate and rhythm, S1, S2 normal, no murmur, click, rub or gallop Extremities: extremities normal, atraumatic, no cyanosis or edema Pulses: Absent left pedal pulses Skin: Skin color, texture, turgor normal. No rashes or lesions Neurologic: Alert and oriented X  3, normal strength and tone. Normal symmetric reflexes. Normal coordination and gait  EKG sinus rhythm at 66 with occasional PVCs.I  Personally reviewed this EKG.  ASSESSMENT AND PLAN:   HTN (hypertension) History of essential hypertension blood pressure measurements at 160/64. He is on amlodipine, benazepril, hydrochlorothiazide and metoprolol. Continue current meds at current dosing  CAD (coronary artery disease) History of CAD status post cardiac catheterization in 2008 after stress test showed ischemia. He had an occluded PDA with collaterals and high-grade OM 2 disease as well as proximal segmental AV groove circumflex disease. He underwent PCI of his AV groove circumflex and OM 2 with drug-eluting stents. He did have a "STEMI such apparently showed no change in his anatomy at calf. His last Myoview performed 08/23/14 was nonischemic. He denies chest pain or shortness of breath.  Tobacco abuse Discontinued one year ago.  Hyperlipidemia History of hyperlipidemia  on statin therapy therapy and lipid profile performed 12/05/16 revealed total cholesterol 116, LDL 71 HDL of 36.  PAD (peripheral artery disease) (HCC) History of peripheral arterial disease status post angiography and intervention by myself 02/26/17. He had diamondback orbital rotational atherectomy of the calcified high-grade ostial left common iliac artery stenosis followed by coverage stenting using a DBX stent. He also had [facility] one directional Atherectomy followed by drug-eluting balloon angioplasty of his entire right SFA. His Dopplers improved. His claudication resolved.      [name] MD FACP,FACC,FAHA, [facility] 04/23/2017 1:54 PM

## 2017-04-23 NOTE — Assessment & Plan Note (Signed)
History of hyperlipidemia on statin therapy therapy and lipid profile performed 12/05/16 revealed total cholesterol 116, LDL 71 HDL of 36.

## 2017-04-23 NOTE — Addendum Note (Signed)
Addended by: Kandice RobinsonsYOUNG, Nohely Whitehorn T on: 04/23/2017 02:00 PM   Modules accepted: Orders

## 2017-04-23 NOTE — Assessment & Plan Note (Signed)
Discontinued one year ago.

## 2017-04-23 NOTE — Assessment & Plan Note (Signed)
History of CAD status post cardiac catheterization in 2008 after stress test showed ischemia. He had an occluded PDA with collaterals and high-grade OM 2 disease as well as proximal segmental AV groove circumflex disease. He underwent PCI of his AV groove circumflex and OM 2 with drug-eluting stents. He did have a "STEMI such apparently showed no change in his anatomy at calf. His last Myoview performed 08/23/14 was nonischemic. He denies chest pain or shortness of breath.

## 2017-04-23 NOTE — Patient Instructions (Signed)
Medication Instructions: Your physician recommends that you continue on your current medications as directed. Please refer to the Current Medication list given to you today.  Testing: Your physician has requested that you have a lower extremity arterial duplex. During this test, ultrasound is used to evaluate arterial blood flow in the legs. Allow one hour for this exam. There are no restrictions or special instructions.  Your physician has requested that you have an ankle brachial index (ABI). During this test an ultrasound and blood pressure cuff are used to evaluate the arteries that supply the arms and legs with blood. Allow thirty minutes for this exam. There are no restrictions or special instructions.  (Every 6 months)  Follow-Up: Your physician wants you to follow-up in: 1 year with Dr. Allyson SabalBerry. You will receive a reminder letter in the mail two months in advance. If you don't receive a letter, please call our office to schedule the follow-up appointment.  If you need a refill on your cardiac medications before your next appointment, please call your pharmacy.

## 2017-04-23 NOTE — Assessment & Plan Note (Signed)
History of peripheral arterial disease status post angiography and intervention by myself 02/26/17. He had diamondback orbital rotational atherectomy of the calcified high-grade ostial left common iliac artery stenosis followed by coverage stenting using a DBX stent. [facility] followed by drug-eluting balloon angioplasty of his entire right SFA. His Dopplers improved. His claudication resolved.

## 2017-05-05 ENCOUNTER — Ambulatory Visit: Payer: Medicare Other | Admitting: Cardiovascular Disease

## 2017-06-17 ENCOUNTER — Other Ambulatory Visit: Payer: Self-pay | Admitting: Physician Assistant

## 2017-08-24 ENCOUNTER — Encounter (HOSPITAL_COMMUNITY): Payer: Self-pay

## 2017-08-24 ENCOUNTER — Emergency Department (HOSPITAL_COMMUNITY)
Admission: EM | Admit: 2017-08-24 | Discharge: 2017-08-24 | Disposition: A | Discharge status: Home / Self Care | Payer: Medicare Other | Attending: Emergency Medicine | Admitting: Emergency Medicine

## 2017-08-24 ENCOUNTER — Other Ambulatory Visit: Payer: Self-pay

## 2017-08-24 ENCOUNTER — Emergency Department (HOSPITAL_COMMUNITY): Payer: Medicare Other

## 2017-08-24 DIAGNOSIS — Z955 Presence of coronary angioplasty implant and graft: Secondary | ICD-10-CM | POA: Diagnosis not present

## 2017-08-24 DIAGNOSIS — Z87891 Personal history of nicotine dependence: Secondary | ICD-10-CM | POA: Diagnosis not present

## 2017-08-24 DIAGNOSIS — I10 Essential (primary) hypertension: Secondary | ICD-10-CM | POA: Diagnosis not present

## 2017-08-24 DIAGNOSIS — I252 Old myocardial infarction: Secondary | ICD-10-CM | POA: Diagnosis not present

## 2017-08-24 DIAGNOSIS — R072 Precordial pain: Secondary | ICD-10-CM | POA: Diagnosis not present

## 2017-08-24 DIAGNOSIS — I2510 Atherosclerotic heart disease of native coronary artery without angina pectoris: Secondary | ICD-10-CM | POA: Diagnosis not present

## 2017-08-24 DIAGNOSIS — R079 Chest pain, unspecified: Secondary | ICD-10-CM | POA: Diagnosis present

## 2017-08-24 DIAGNOSIS — E119 Type 2 diabetes mellitus without complications: Secondary | ICD-10-CM | POA: Diagnosis not present

## 2017-08-24 LAB — BASIC METABOLIC PANEL
ANION GAP: 11 (ref 5–15)
BUN: 10 mg/dL (ref 6–20)
CHLORIDE: 106 mmol/L (ref 101–111)
CO2: 23 mmol/L (ref 22–32)
Calcium: 9.5 mg/dL (ref 8.9–10.3)
Creatinine, Ser: 0.89 mg/dL (ref 0.61–1.24)
Glucose, Bld: 177 mg/dL — ABNORMAL HIGH (ref 65–99)
POTASSIUM: 3.4 mmol/L — AB (ref 3.5–5.1)
SODIUM: 140 mmol/L (ref 135–145)

## 2017-08-24 LAB — CBC
HEMATOCRIT: 37.7 % — AB (ref 39.0–52.0)
Hemoglobin: 12.8 g/dL — ABNORMAL LOW (ref 13.0–17.0)
MCH: 30.7 pg (ref 26.0–34.0)
MCHC: 34 g/dL (ref 30.0–36.0)
MCV: 90.4 fL (ref 78.0–100.0)
Platelets: 297 10*3/uL (ref 150–400)
RBC: 4.17 MIL/uL — AB (ref 4.22–5.81)
RDW: 13.6 % (ref 11.5–15.5)
WBC: 4.9 10*3/uL (ref 4.0–10.5)

## 2017-08-24 LAB — I-STAT TROPONIN, ED
Troponin i, poc: 0 ng/mL (ref 0.00–0.08)
Troponin i, poc: 0 ng/mL (ref 0.00–0.08)

## 2017-08-24 NOTE — ED Notes (Signed)
Patient verbalizes understanding of discharge instructions. Opportunity for questioning and answers were provided. Armband removed by staff, pt discharged from ED ambulatory.   

## 2017-08-24 NOTE — ED Provider Notes (Signed)
Emergency Department Provider Note   I have reviewed the triage vital signs and the nursing notes.   HISTORY  Chief Complaint Chest Pain   HPI Austin Mclaughlin is a 72 y.o. male with PMH of CAD s/p PCI, DM, HTN, HLD's to the emergency department for evaluation of left-sided chest soreness occurring intermittently over the past several days.  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  Patient noticed worsening pain when he was lifting the bags or throwing them and pain improved with rest.  Since that time he said intermittent chest discomfort not associated with movement or exertion.  Denies pleuritic pain.  No associated shortness of breath or diaphoresis.  No nausea.  He denies this feeling like prior heart related pain.  Denies fevers, chills, or productive cough. No radiation of symptoms or modifying factors.    Past Medical History:  Diagnosis Date  . Arthritis    "all over" (02/17/2013)  . Chronic lower back pain   . Coronary artery disease   . GERD (gastroesophageal reflux disease)   . High cholesterol   . Hypertension   . Myocardial infarction [facility](HCC) 2008   stents to AV CFX & OM  . Type II diabetes mellitus [facility]HCC)     Patient Active Problem List   Diagnosis Date Noted  . Claudication in peripheral vascular disease (HCC) 02/26/2017  . Chest pain, rule out acute myocardial infarction 12/05/2016  . PAD (peripheral artery disease) (HCC) 12/05/2016  . Hypokalemia   . Hyperlipidemia 04/26/2014  . CAD (coronary artery disease) 02/16/2013  . Chest pain 02/16/2013  . Tobacco abuse 02/16/2013  . HTN (hypertension) 02/16/2013  . DM type 2 (diabetes mellitus, type 2) (HCC) 02/16/2013    Past Surgical History:  Procedure Laterality Date  . CARDIAC CATHETERIZATION  02/15/2013  . CORONARY ANGIOPLASTY WITH STENT PLACEMENT  2008   DES AV CFX & OM2  . LOWER EXTREMITY ANGIOGRAPHY Bilateral 02/26/2017   Procedure: Lower Extremity Angiography;  Surgeon: Runell GessBerry,  Jonathan J, MD;  [facility];  Service: Cardiovascular;  Laterality: Bilateral;  . NM MYOVIEW LTD  2016   Normal  . PERIPHERAL VASCULAR ATHERECTOMY Right 02/26/2017   Procedure: PERIPHERAL VASCULAR ATHERECTOMY;  Surgeon: Runell GessBerry, Jonathan J, MD;  [facility];  Service: Cardiovascular;  Laterality: Right;  SFA left common iliac athrectomy   . PERIPHERAL VASCULAR INTERVENTION Left 02/26/2017   Procedure: PERIPHERAL VASCULAR INTERVENTION;  Surgeon: Runell GessBerry, Jonathan J, MD;  [facility];  Service: Cardiovascular;  Laterality: Left;  left common iliac    Current Outpatient Rx  . Order #: [id number] Class: Historical Med  . Order #: [id number] Class: OTC  . Order #: [id number] Class: Historical Med  . Order #: [id number] Class: Historical Med  . Order #: [id number] Class: Historical Med  . Order #: [id number] Class: Historical Med  . Order #: [id number] Class: Historical Med  . Order #: [id number] Class: Historical Med  . Order #: [id number] Class: Normal  . Order #: [id number] Class: Normal  . Order #: [id number] Class: Historical Med  . Order #: [id number] Class: Historical Med    Allergies Patient has no known allergies.  Family History  Problem Relation Age of Onset  . Hypertension Father     Social History Social History   Tobacco Use  . Smoking status: Former Smoker    Packs/day: 1.00    Years: 39.00    Pack years: 39.00    Types: Cigarettes    Last attempt to quit: 04/17/2014  Years since quitting: 3.3  . Smokeless tobacco: Never Used  . Tobacco comment: every once in a while  Substance Use Topics  . Alcohol use: Yes    Alcohol/week: 8.4 oz    Types: 12 Cans of beer, 2 Shots of liquor per week    Comment: 02/17/2013 "6 beers/weekend day"  . Drug use: No    Review of Systems  Constitutional: No fever/chills Eyes: No visual changes. ENT: No sore throat. Cardiovascular: Positive intermittent chest pain. Respiratory: Denies shortness of  breath. Gastrointestinal: No abdominal pain.  No nausea, no vomiting.  No diarrhea.  No constipation. Genitourinary: Negative for dysuria. Musculoskeletal: Negative for back pain. Skin: Negative for rash. Neurological: Negative for headaches, focal weakness or numbness.  10-point ROS otherwise negative.  ____________________________________________   PHYSICAL EXAM:  VITAL SIGNS: ED Triage Vitals  Enc Vitals Group     BP 08/24/17 0743 (!) 165/70     Pulse Rate 08/24/17 0743 87     Resp 08/24/17 0743 19     Temp 08/24/17 0743 98.3 F (36.8 C)     Temp Source 08/24/17 0949 Oral     SpO2 08/24/17 0743 99 %     Weight 08/24/17 0744 183 lb (83 kg)     Height 08/24/17 0744 5\' 9"  (1.753 m)     Pain Score 08/24/17 0744 0   Constitutional: Alert and oriented. Well appearing and in no acute distress. Eyes: Conjunctivae are normal.  Head: Atraumatic. Nose: No congestion/rhinnorhea. Mouth/Throat: Mucous membranes are moist.  Oropharynx non-erythematous. Neck: No stridor.   Cardiovascular: Normal rate, regular rhythm. Good peripheral circulation. Grossly normal heart sounds.   Respiratory: Normal respiratory effort.  No retractions. Lungs CTAB. Gastrointestinal: Soft and nontender. No distention.  Musculoskeletal: No lower extremity tenderness nor edema. No gross deformities of extremities. Neurologic:  Normal speech and language. No gross focal neurologic deficits are appreciated.  Skin:  Skin is warm, dry and intact. No rash noted.  ____________________________________________   LABS (all labs ordered are listed, but only abnormal results are displayed)  Labs Reviewed  BASIC METABOLIC PANEL - Abnormal; Notable for the following components:      Result Value   Potassium 3.4 (*)    Glucose, Bld 177 (*)    All other components within normal limits  CBC - Abnormal; Notable for the following components:   RBC 4.17 (*)    Hemoglobin 12.8 (*)    HCT 37.7 (*)    All other  components within normal limits  I-STAT TROPONIN, ED  I-STAT TROPONIN, ED   ____________________________________________  EKG   EKG Interpretation  Date/Time:  Monday August 24 2017 07:40:55 EST Ventricular Rate:  87 PR Interval:  164 QRS Duration: 78 QT Interval:  360 QTC Calculation: 433 R Axis:   25 Text Interpretation:  Normal sinus rhythm Normal ECG No STEMI.  Similar to prior.  Confirmed by Alona Bene [phone]) on 08/24/2017 11:52:49 AM       ____________________________________________  RADIOLOGY  Dg Chest 2 View  Result Date: 08/24/2017 CLINICAL DATA:  Chest pain EXAM: CHEST  2 VIEW COMPARISON:  December 05, 2016 FINDINGS: Lungs are clear. The heart size and pulmonary vascularity are normal. No adenopathy. No pneumothorax. No bone lesions. There is calcification in each carotid artery. There is aortic atherosclerosis. IMPRESSION: No edema or consolidation. There is bilateral carotid artery calcification. There is aortic atherosclerosis. Aortic Atherosclerosis (ICD10-I70.0). Electronically Signed   By: Bretta Bang III M.D.   On: 08/24/2017 08:10  ____________________________________________   PROCEDURES  Procedure(s) performed:   Procedures  None ____________________________________________   INITIAL IMPRESSION / ASSESSMENT AND PLAN / ED COURSE  Pertinent labs & imaging results that were available during my care of the patient were reviewed by me and considered in my medical decision making (see chart for details).  Patient presents to the emergency department with left-sided chest wall pain that is fairly atypical for CAD.  He does have significant CAD risk factors.  Pain seems to be worse with lifting.  Considered alternative diagnoses listed below.  She does very eager to return home and possibly follow with his cardiologist.  And for repeat troponin and reassess.  Differential includes all life-threatening causes for chest pain. This includes but is  not exclusive to acute coronary syndrome, aortic dissection, pulmonary embolism, cardiac tamponade, community-acquired pneumonia, pericarditis, musculoskeletal chest wall pain, etc.  Troponin is negative x 2.  Reviewed imaging which is unremarkable.  The patient is very resistant to staying in the hospital overnight for further chest pain evaluation.  In light of this, I discussed calling his cardiologist office today to schedule an appointment in the next 24-48 hours.  Discussed that he must return to the emergency department immediately if his chest pain returns or worsens.  He verbalizes understanding with this plan.   ____________________________________________  FINAL CLINICAL IMPRESSION(S) / ED DIAGNOSES  Final diagnoses:  Precordial chest pain    Note:  This document was prepared using Dragon voice recognition software and may include unintentional dictation errors.  Alona Bene, MD Emergency Medicine    Kyheem Bathgate, Arlyss Repress, MD 08/24/17 [phone]

## 2017-08-24 NOTE — Discharge Instructions (Signed)

## 2017-08-24 NOTE — ED Triage Notes (Signed)
Pt presents to the ed with complaints of upper centralized chest pain since Thursday. Denies any other symptoms. States he thinks it is a pulled muscle. NAD in triage

## 2017-08-25 ENCOUNTER — Encounter: Payer: Self-pay | Admitting: Cardiovascular Disease

## 2017-08-25 ENCOUNTER — Ambulatory Visit (INDEPENDENT_AMBULATORY_CARE_PROVIDER_SITE_OTHER): Payer: Medicare Other | Admitting: Cardiovascular Disease

## 2017-08-25 DIAGNOSIS — I10 Essential (primary) hypertension: Secondary | ICD-10-CM | POA: Diagnosis not present

## 2017-08-25 DIAGNOSIS — Z720 Tobacco use: Secondary | ICD-10-CM | POA: Diagnosis not present

## 2017-08-25 DIAGNOSIS — I2510 Atherosclerotic heart disease of native coronary artery without angina pectoris: Secondary | ICD-10-CM

## 2017-08-25 DIAGNOSIS — E7800 Pure hypercholesterolemia, unspecified: Secondary | ICD-10-CM

## 2017-08-25 DIAGNOSIS — I739 Peripheral vascular disease, unspecified: Secondary | ICD-10-CM

## 2017-08-25 NOTE — Assessment & Plan Note (Signed)
History of essential hypertension blood pressure measured at 154/70. He is on amlodipine, benazepril, hydrochlorothiazide and metoprolol. Continue current meds at current dosing.

## 2017-08-25 NOTE — Assessment & Plan Note (Signed)
History of discontinued tobacco abuse 

## 2017-08-25 NOTE — Patient Instructions (Signed)
Medication Instructions: Your physician recommends that you continue on your current medications as directed. Please refer to the Current Medication list given to you today.   Testing/Procedures:  PLEASE schedule for April:  Your physician has requested that you have a aorta and iliac duplex. During this test, an ultrasound is used to evaluate blood flow to the aorta and iliac arteries. Allow one hour for this exam. Do not eat after midnight the day before and avoid carbonated beverages.   Your physician has requested that you have a lower extremity arterial duplex. During this test, ultrasound is used to evaluate arterial blood flow in the legs. Allow one hour for this exam. There are no restrictions or special instructions.  Your physician has requested that you have an ankle brachial index (ABI). During this test an ultrasound and blood pressure cuff are used to evaluate the arteries that supply the arms and legs with blood. Allow thirty minutes for this exam. There are no restrictions or special instructions.   Follow-Up: We request that you follow-up in: 2 months with Corine ShelterLuke Kilroy, PA and in 12 months with Dr San MorelleBerry  You will receive a reminder letter in the mail two months in advance. If you don't receive a letter, please call our office to schedule the follow-up appointment.  If you need a refill on your cardiac medications before your next appointment, please call your pharmacy.

## 2017-08-25 NOTE — Assessment & Plan Note (Signed)
History of peripheral arterial disease status post angiography by myself 02/26/17 with stenting of his calcified ostial left common iliac artery with a VBX covered stent, atherectomy and drug-eluting angioplasty of his entire calcified right SFA. He had one-vessel runoff on the right and 2 on the left with an occluded left SFA. His claudication resolved. His Dopplers reveal a patent stent in his iliac with a patent right SFA and a right ABI of 0.73. He currently denies claudication.

## 2017-08-25 NOTE — Assessment & Plan Note (Signed)
History of CAD status post CABG in 2008 after a stress test showing ischemia revealing an occluded PDA with collaterals, high-grade long tubular disease and proximal segmental AV groove circumflex disease. Underwent PCI and drug-eluting stenting of his AV groove circumflex and OM 2 and had done well after that. He was recatheterized after an episode of unstable angina revealing a patent LAD stent with diffuse RCA disease. Medical therapy was recommended. He developed chest pain last Tuesday while lifting heavy objects at work and was seen in the emergency room. He's had no recurrent symptoms.

## 2017-08-25 NOTE — Progress Notes (Signed)
08/25/2017 Austin Mclaughlin   12/14/1945  [id number]  Primary Physician Ralene OkMoreira, Roy, MD Primary Cardiologist: Runell GessJonathan J Payton Prinsen MD FACP, [place], [place], [place]  HPI:  Austin Mclaughlin is a 72 y.o.  married, father of 4 who I last saw in the office 04/23/17.., He has a history of CAD, s/p cath in 2008 after a stress test for CP showed ischemia. The cath, at that time, demonstrated an occluded PDA and collaterals with high grade OM2 disease and proximal segmental AV groove left circ disease. He underwent PCI of the AV groove left circ and OM2 with DES. He had reportedly done well until developing chest pain the day prior to arrival to the ED. The CP had been intermittent for over the course of 24 hours, before recurring and prompting the patient to seek evaluation. He described the pain as a sharp, stinging pain in his left chest with no SOB, nausea or diaphoresis. [facility] he was noted to have 4mm of ST elevation in V1 and V2 and a STEMI was called and [facility]. [facility], his chest pain resolved. He was taken urgently to the cath lab. The procedure was performed by Dr. Allyson SabalBerry via the right femoral artery. It demonstrated a patent LAD stent and diffuse RCA disease. Medical therapy was recommended. He left the cath lab in stable condition. Post-cath, he was noted to have moderate hypertension. He was placed on Lopressor, HCTZ and lisinopril. He had mild improvement in BP. The patient [facility] office the Monday after discharge and his lisinopril was up titrated. Since that time he's had no chest pain and for some reason no longer is on an ACE inhibitor. He also has stopped smoking several months ago after smoking one pack per day for 55 years.   Since I saw him back in February he was evaluated in the emergency room at Dr. Elease HashimotoNahser for chest pain. His enzymes were negative and a Myoview stress test was low risk. He has had no recurrent chest pain since that time. He  does complain of some calf claudication bilaterally which is lifestyle limiting. He did have segmental pressures [facility]16 revealing a right ABI 0.54 and a left of 0.74. These did drop to .15 and .44 respectively with exercise. Based on this, I decided to proceed with duplex ultrasound and potential endovascular therapy. This was performed 01/26/17 revealing a right ABI 0.53 with a high-frequency signal of the origin of his right SFA and a left ABI 0.75 with an occluded mid left SFA.   I performed angiography and intervention on him 02/26/17. I performed a diamond echo orbital facial atherectomy followed by V BX cover stenting of a calcified high-grade left common iliac artery stenosis in [facility] to [facility] one directional atherectomy and drug-eluting glandular plasty of his entire right SFA. His claudication resolved and his Dopplers improved.  Is recently seen in the emergency room with chest pain last week after lifting heavy objects at work. His pain has not recurred. He no longer has claudication either.    Current Meds  Medication Sig  . amLODipine-benazepril (LOTREL) 10-40 MG capsule Take 1 capsule by mouth daily.  Marland Kitchen. aspirin EC 81 MG EC tablet Take 1 tablet (81 mg total) by mouth daily.  Marland Kitchen. atorvastatin (LIPITOR) 80 MG tablet Take 80 mg by mouth daily.   . clopidogrel (PLAVIX) 75 MG tablet Take 75 mg by mouth  daily.  . hydrochlorothiazide (HYDRODIURIL) 25 MG tablet Take 25 mg by mouth daily.   . insulin glargine (LANTUS) 100 UNIT/ML injection Inject 36 Units into the skin every evening.   . magnesium oxide (MAG-OX) 400 MG tablet Take 400 mg by mouth daily.  . metFORMIN (GLUCOPHAGE) 1000 MG tablet Take 1,000 mg by mouth 2 (two) times daily.  . metoprolol succinate (TOPROL-XL) 25 MG 24 hr tablet TAKE 1 TABLET(25 MG) BY MOUTH DAILY  . nitroGLYCERIN (NITROSTAT) 0.4 MG SL tablet Place 1 tablet (0.4 mg total) under the tongue every 5 (five) minutes x 3 doses as needed for  chest pain.  Marland Kitchen Potassium Chloride ER 20 MEQ TBCR Take 20 mEq by mouth daily.     No Known Allergies  Social History   Socioeconomic History  . Marital status: Married    Spouse name: Not on file  . Number of children: Not on file  . Years of education: Not on file  . Highest education level: Not on file  Social Needs  . Financial resource strain: Not on file  . Food insecurity - worry: Not on file  . Food insecurity - inability: Not on file  . Transportation needs - medical: Not on file  . Transportation needs - non-medical: Not on file  Occupational History  . 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 Smoking status: Former Smoker    Packs/day: 1.00    Years: 39.00    Pack years: 39.00    Types: Cigarettes    Last attempt to quit: 04/17/2014    Years since quitting: 3.3  . Smokeless tobacco: Never Used  . Tobacco comment: every once in a while  Substance and Sexual Activity  . Alcohol use: Yes    Alcohol/week: 8.4 oz    Types: 12 Cans of beer, 2 Shots of liquor per week    Comment: 02/17/2013 "6 beers/weekend day"  . Drug use: No  . Sexual activity: Yes  Other Topics Concern  . Not on file  Social History Narrative  . Not on file     Review of Systems: General: negative for chills, fever, night sweats or weight changes.  Cardiovascular: negative for chest pain, dyspnea on exertion, edema, orthopnea, palpitations, paroxysmal nocturnal dyspnea or shortness of breath Dermatological: negative for rash Respiratory: negative for cough or wheezing Urologic: negative for hematuria Abdominal: negative for nausea, vomiting, diarrhea, bright red blood per rectum, melena, or hematemesis Neurologic: negative for visual changes, syncope, or dizziness All other systems reviewed and are otherwise negative except as noted above.    Blood pressure (!) 154/70, pulse 70, height 5\' 8"  (1.727 m), weight 177 lb 3.2 oz  (80.4 kg), SpO2 98 %.  General appearance: alert and no distress Neck: no adenopathy, no carotid bruit, no JVD, supple, symmetrical, trachea midline and thyroid not enlarged, symmetric, no tenderness/mass/nodules Lungs: clear to auscultation bilaterally Heart: regular rate and rhythm, S1, S2 normal, no murmur, click, rub or gallop Extremities: extremities normal, atraumatic, no cyanosis or edema Pulses: 2+ and symmetric Skin: Skin color, texture, turgor normal. No rashes or lesions Neurologic: Alert and oriented X 3, normal strength and tone. Normal symmetric reflexes. Normal coordination and gait  EKG not performed today  ASSESSMENT AND PLAN:   HTN (hypertension) History of essential hypertension blood pressure measured at 154/70. He is on amlodipine, benazepril, hydrochlorothiazide and metoprolol. Continue current meds at current dosing.  Tobacco abuse History of  discontinued tobacco abuse  CAD (coronary artery disease) History of CAD status post CABG in 2008 after a stress test showing ischemia revealing an occluded PDA with collaterals, high-grade long tubular disease and proximal segmental AV groove circumflex disease. Underwent PCI and drug-eluting stenting of his AV groove circumflex and OM 2 and had done well after that. He was recatheterized after an episode of unstable angina revealing a patent LAD stent with diffuse RCA disease. Medical therapy was recommended. He developed chest pain last Tuesday while lifting heavy objects at work and was seen in the emergency room. He's had no recurrent symptoms.    Hyperlipidemia History of hyperlipidemia on statin therapy with recent lipid profile from 12/05/16 with a total cholesterol 116, LDL 71 and HDL of 36  PAD (peripheral artery disease) (HCC) History of peripheral arterial disease status post angiography by myself 02/26/17 with stenting of his calcified ostial left common iliac artery with a VBX covered stent, atherectomy and  drug-eluting angioplasty of his entire calcified right SFA. He had one-vessel runoff on the right and 2 on the left with an occluded left SFA. His claudication resolved. His Dopplers reveal a patent stent in his iliac with a patent right SFA and a right ABI of 0.73. He currently denies claudication.      [name] MD FACP,FACC,FAHA, [facility] 08/25/2017 10:42 AM

## 2017-08-25 NOTE — Assessment & Plan Note (Signed)
History of hyperlipidemia on statin therapy with recent lipid profile from 12/05/16 with a total cholesterol 116, LDL 71 and HDL of 36

## 2017-09-04 ENCOUNTER — Ambulatory Visit (HOSPITAL_BASED_OUTPATIENT_CLINIC_OR_DEPARTMENT_OTHER)
Admission: RE | Admit: 2017-09-04 | Discharge: 2017-09-04 | Disposition: A | Discharge status: Home / Self Care | Payer: Medicare Other | Source: Ambulatory Visit | Attending: Cardiology | Admitting: Cardiology

## 2017-09-04 ENCOUNTER — Ambulatory Visit (HOSPITAL_COMMUNITY)
Admission: RE | Admit: 2017-09-04 | Discharge: 2017-09-04 | Disposition: A | Discharge status: Home / Self Care | Payer: Medicare Other | Source: Ambulatory Visit | Attending: Cardiology | Admitting: Cardiology

## 2017-09-04 DIAGNOSIS — I739 Peripheral vascular disease, unspecified: Secondary | ICD-10-CM

## 2017-09-04 DIAGNOSIS — I70291 Other atherosclerosis of native arteries of extremities, right leg: Secondary | ICD-10-CM | POA: Diagnosis not present

## 2017-09-04 DIAGNOSIS — I708 Atherosclerosis of other arteries: Secondary | ICD-10-CM | POA: Insufficient documentation

## 2017-10-15 ENCOUNTER — Other Ambulatory Visit: Payer: Self-pay | Admitting: Internal Medicine

## 2017-10-15 DIAGNOSIS — Z87891 Personal history of nicotine dependence: Secondary | ICD-10-CM

## 2017-10-23 ENCOUNTER — Encounter: Payer: Self-pay | Admitting: Cardiology

## 2017-10-23 ENCOUNTER — Ambulatory Visit (INDEPENDENT_AMBULATORY_CARE_PROVIDER_SITE_OTHER): Payer: Medicare Other | Admitting: Cardiology

## 2017-10-23 ENCOUNTER — Ambulatory Visit
Admission: RE | Admit: 2017-10-23 | Discharge: 2017-10-23 | Disposition: A | Discharge status: Home / Self Care | Payer: Medicare Other | Source: Ambulatory Visit | Attending: Internal Medicine | Admitting: Internal Medicine

## 2017-10-23 DIAGNOSIS — I739 Peripheral vascular disease, unspecified: Secondary | ICD-10-CM | POA: Diagnosis not present

## 2017-10-23 DIAGNOSIS — R911 Solitary pulmonary nodule: Secondary | ICD-10-CM

## 2017-10-23 DIAGNOSIS — R0989 Other specified symptoms and signs involving the circulatory and respiratory systems: Secondary | ICD-10-CM | POA: Diagnosis not present

## 2017-10-23 DIAGNOSIS — I10 Essential (primary) hypertension: Secondary | ICD-10-CM | POA: Diagnosis not present

## 2017-10-23 DIAGNOSIS — I2510 Atherosclerotic heart disease of native coronary artery without angina pectoris: Secondary | ICD-10-CM | POA: Diagnosis not present

## 2017-10-23 DIAGNOSIS — Z87891 Personal history of nicotine dependence: Secondary | ICD-10-CM

## 2017-10-23 DIAGNOSIS — Z9861 Coronary angioplasty status: Secondary | ICD-10-CM

## 2017-10-23 DIAGNOSIS — R42 Dizziness and giddiness: Secondary | ICD-10-CM

## 2017-10-23 NOTE — Patient Instructions (Signed)
Medication Instructions:  DECREASE- HCTZ 12.5 mg(1/2 tablet) once a day  If you need a refill on your cardiac medications before your next appointment, please call your pharmacy.  Labwork: None Ordered   Testing/Procedures: None Ordered  Follow-Up: Your physician wants you to follow-up in: 6 Months with Dr Allyson SabalBerry.      Thank you for choosing [facility]!!

## 2017-10-23 NOTE — Assessment & Plan Note (Signed)
Controlled.  

## 2017-10-23 NOTE — Assessment & Plan Note (Signed)
He is for f/u chest CT today

## 2017-10-23 NOTE — Assessment & Plan Note (Signed)
Check doppler 

## 2017-10-23 NOTE — Assessment & Plan Note (Signed)
Intermittent dizziness when standing, no syncope or pre syncope

## 2017-10-23 NOTE — Assessment & Plan Note (Signed)
On high dose statin Rx-LDL 71 June 2018

## 2017-10-23 NOTE — Assessment & Plan Note (Signed)
RSFA and LCIA PTA Aug 2018 Moderate RICA stenosis, mild LICA stenosis June 2018

## 2017-10-23 NOTE — Progress Notes (Signed)
10/23/2017 Austin Mclaughlin   Jul 26, 1945  [id number]  Primary Physician Ralene Ok, MD Primary Cardiologist: Dr Allyson Sabal  HPI:  Pleasant 72 y/o male followed by Dr Allyson Sabal with a history of CAD, s/p CFX AV and OM2 PCI with DES in 2008. He had a cath in 2014 in the setting of a (?) STEMI. I could not find the actual cath report but he apparently did not have a PCI.  He had a Myoview in June of 2018 that was low risk. In Aug 2018 he had a RSFA PTA and LCIA PTA. He has done well since.   He is in the office today for a 6 month follow up. He denies chest pain. He loads and unloads trucks for work. He has occasional dizziness but no syncope or pre syncope. No tachycardia.    Current Outpatient Medications  Medication Sig Dispense Refill  . amLODipine-benazepril (LOTREL) 10-40 MG capsule Take 1 capsule by mouth daily.  2  . aspirin EC 81 MG EC tablet Take 1 tablet (81 mg total) by mouth daily.    Marland Kitchen atorvastatin (LIPITOR) 80 MG tablet Take 80 mg by mouth daily.   3  . clopidogrel (PLAVIX) 75 MG tablet Take 75 mg by mouth daily.    . hydrochlorothiazide (HYDRODIURIL) 25 MG tablet Take 12.5 mg by mouth daily.   5  . Insulin Glargine-Lixisenatide (SOLIQUA) 100-33 UNT-MCG/ML SOPN Inject 30 Units into the skin daily.    . magnesium oxide (MAG-OX) 400 MG tablet Take 400 mg by mouth daily.    . metoprolol succinate (TOPROL-XL) 25 MG 24 hr tablet TAKE 1 TABLET(25 MG) BY MOUTH DAILY 90 tablet 1  . nitroGLYCERIN (NITROSTAT) 0.4 MG SL tablet Place 1 tablet (0.4 mg total) under the tongue every 5 (five) minutes x 3 doses as needed for chest pain. 25 tablet 2  . pantoprazole (PROTONIX) 40 MG tablet Take 1 tablet by mouth daily.  1  . Potassium Chloride ER 20 MEQ TBCR Take 20 mEq by mouth daily.  2   No current facility-administered medications for this visit.     No Known Allergies  Past Medical History:  Diagnosis Date  . Arthritis    "all over" (02/17/2013)  . Chronic lower back pain   . Coronary artery  disease   . GERD (gastroesophageal reflux disease)   . High cholesterol   . Hypertension   . Myocardial infarction [facility]) 2008   stents to AV CFX & OM  . Type II diabetes mellitus (HCC)     Social History   Socioeconomic History  . Marital status: Married    Spouse name: Not on file  . Number of children: Not on file  . Years of education: Not on file  . Highest education level: Not on file  Occupational History  . 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 Financial resource strain: Not on file  . Food insecurity:    Worry: Not on file    Inability: Not on file  . Transportation needs:    Medical: Not on file    Non-medical: Not on file  Tobacco Use  . Smoking status: Former Smoker    Packs/day: 1.00    Years: 39.00    Pack years: 39.00    Types: Cigarettes    Last attempt to quit: 04/17/2014    Years since quitting: 3.5  . Smokeless tobacco: Never Used  . Tobacco comment:  every once in a while  Substance and Sexual Activity  . Alcohol use: Yes    Alcohol/week: 8.4 oz    Types: 12 Cans of beer, 2 Shots of liquor per week    Comment: 02/17/2013 "6 beers/weekend day"  . Drug use: No  . Sexual activity: Yes  Lifestyle  . Physical activity:    Days per week: Not on file    Minutes per session: Not on file  . Stress: Not on file  Relationships  . Social connections:    Talks on phone: Not on file    Gets together: Not on file    Attends religious service: Not on file    Active member of club or organization: Not on file    Attends meetings of clubs or organizations: Not on file    Relationship status: Not on file  . Intimate partner violence:    Fear of current or ex partner: Not on file    Emotionally abused: Not on file    Physically abused: Not on file    Forced sexual activity: Not on file  Other Topics Concern  . Not on file  Social History Narrative  . Not on file     Family History    Problem Relation Age of Onset  . Hypertension Father      Review of Systems: General: negative for chills, fever, night sweats or weight changes.  Cardiovascular: negative for chest pain, dyspnea on exertion, edema, orthopnea, palpitations, paroxysmal nocturnal dyspnea or shortness of breath Dermatological: negative for rash Respiratory: negative for cough or wheezing Urologic: negative for hematuria Abdominal: negative for nausea, vomiting, diarrhea, bright red blood per rectum, melena, or hematemesis Neurologic: negative for visual changes, syncope, or dizziness All other systems reviewed and are otherwise negative except as noted above.    Blood pressure (!) 148/70, pulse 80, height 5\' 8"  (1.727 m), weight 174 lb (78.9 kg).  General appearance: alert, cooperative and no distress Neck: no JVD and LCA bruit Lungs: clear to auscultation bilaterally Heart: regular rate and rhythm and 2/6 systolic murmur LSB aapical area Extremities: extremities normal, atraumatic, no cyanosis or edema Skin: Skin color, texture, turgor normal. No rashes or lesions Neurologic: Grossly normal   ASSESSMENT AND PLAN:   Dizziness Intermittent dizziness when standing, no syncope or pre syncope  Bruit of left carotid artery Check doppler  CAD S/P percutaneous coronary angioplasty CFX AV and OM2 PCI with DES 2008 Restudy 2014 in setting of STEMI-  No PCI done- (records are not clear and I could not find cath report) Myoview low risk June 2018  PAD (peripheral artery disease) (HCC) RSFA and LCIA PTA Aug 2018 Moderate RICA stenosis, mild LICA stenosis June 2018  HTN (hypertension) Controlled  Pulmonary nodule He is for f/u chest CT today  Dyslipidemia, goal LDL below 70 On high dose statin Rx-LDL 71 June 2018   PLAN  Decrease HCTZ to 12.5 mg daily. F/U Dr Allyson SabalBerry in 6 months  Corine ShelterLuke Zarius Furr [facility] 10/23/2017 10:55 AM

## 2017-10-23 NOTE — Assessment & Plan Note (Signed)
CFX AV and OM2 PCI with DES 2008 Restudy 2014 in setting of STEMI-  No PCI done- (records are not clear and I could not find cath report) Myoview low risk June 2018 

## 2017-12-11 ENCOUNTER — Other Ambulatory Visit: Payer: Self-pay | Admitting: *Deleted

## 2017-12-11 DIAGNOSIS — I739 Peripheral vascular disease, unspecified: Secondary | ICD-10-CM

## 2018-01-05 ENCOUNTER — Other Ambulatory Visit: Payer: Self-pay | Admitting: Cardiovascular Disease

## 2018-01-05 DIAGNOSIS — I6523 Occlusion and stenosis of bilateral carotid arteries: Secondary | ICD-10-CM

## 2018-01-12 ENCOUNTER — Ambulatory Visit (HOSPITAL_COMMUNITY)
Admission: RE | Admit: 2018-01-12 | Discharge: 2018-01-12 | Disposition: A | Discharge status: Home / Self Care | Payer: Medicare Other | Source: Ambulatory Visit | Attending: Cardiovascular Disease | Admitting: Cardiovascular Disease

## 2018-01-12 DIAGNOSIS — I739 Peripheral vascular disease, unspecified: Secondary | ICD-10-CM | POA: Diagnosis not present

## 2018-01-12 DIAGNOSIS — I6523 Occlusion and stenosis of bilateral carotid arteries: Secondary | ICD-10-CM | POA: Diagnosis not present

## 2018-01-13 ENCOUNTER — Other Ambulatory Visit: Payer: Self-pay | Admitting: *Deleted

## 2018-01-13 DIAGNOSIS — I6523 Occlusion and stenosis of bilateral carotid arteries: Secondary | ICD-10-CM

## 2018-01-13 DIAGNOSIS — I739 Peripheral vascular disease, unspecified: Secondary | ICD-10-CM

## 2018-01-13 NOTE — Progress Notes (Signed)
vas 

## 2018-01-15 ENCOUNTER — Other Ambulatory Visit: Payer: Self-pay | Admitting: Cardiovascular Disease

## 2018-01-15 NOTE — Telephone Encounter (Signed)
Rx(s) sent to pharmacy electronically.  

## 2018-03-01 IMAGING — CT CT CHEST LUNG CANCER SCREENING LOW DOSE W/O CM
1 of 5 series · 15 of 40 positions shown, 19 images · non-contrast
Comparison: 07/24/2014 screening chest CT.

CLINICAL DATA: 70-year-old asymptomatic male current smoker with 30
pack-year smoking history.

EXAM:
CT CHEST WITHOUT CONTRAST LOW-DOSE FOR LUNG CANCER SCREENING
TECHNIQUE: Multidetector CT imaging of the chest was performed following the
standard protocol without IV contrast.

[Series 3: lung windows · axial · 0.70mm/px · z∈[-235,+39]mm · 15 of 245 slices shown, 19 images]
[im 13/245  mediastinal]
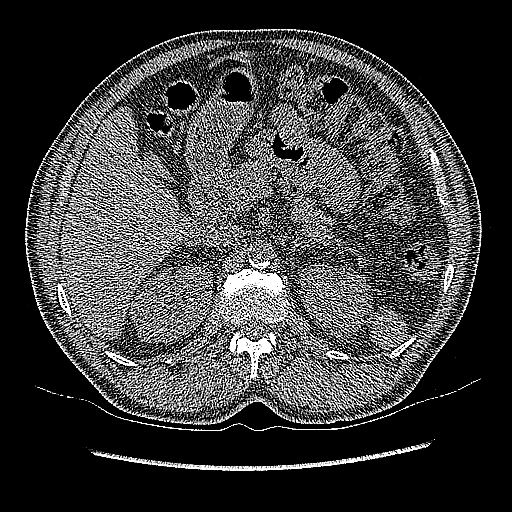
[im 13/245  lung]
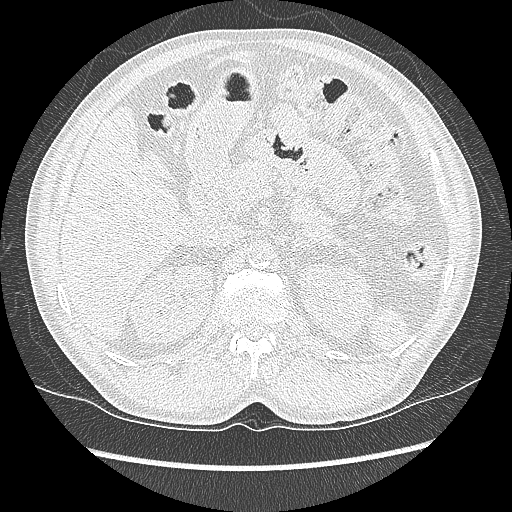
[im 25/245  lung]
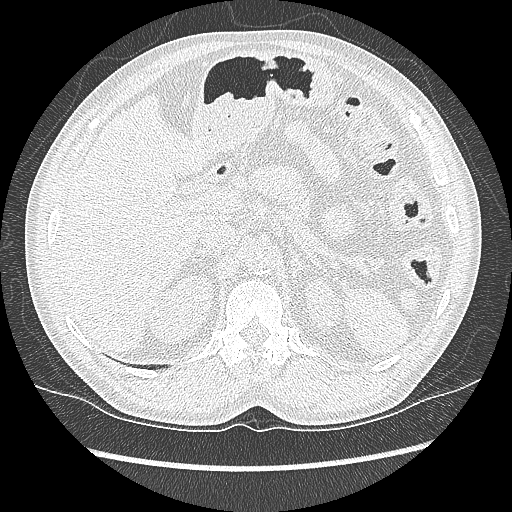
[im 49/245  lung]
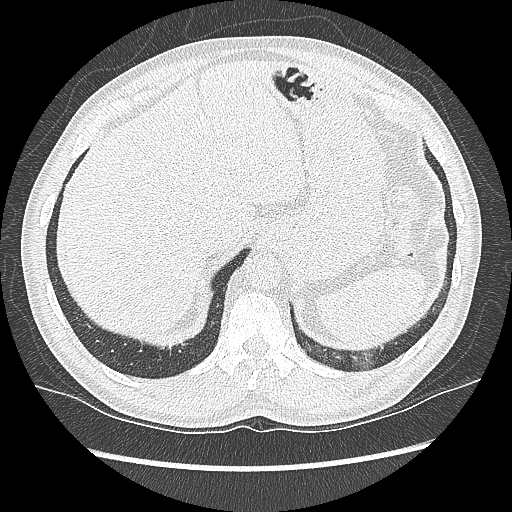
[im 62/245  lung]
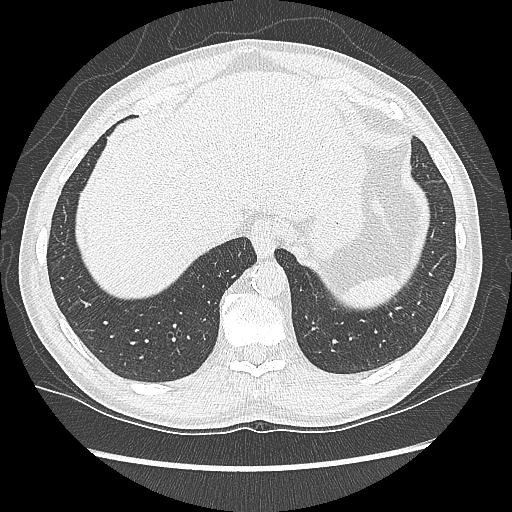
[im 74/245  mediastinal]
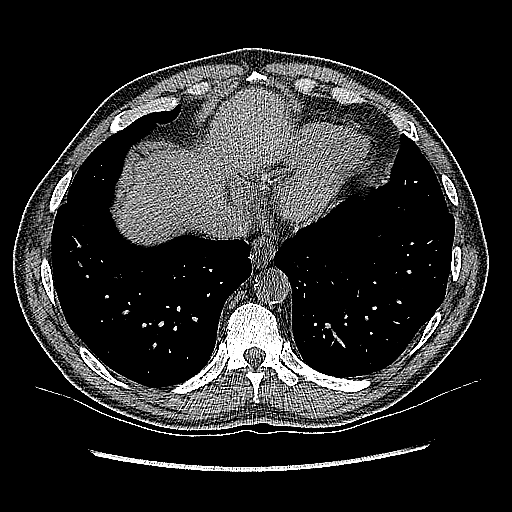
[im 74/245  lung]
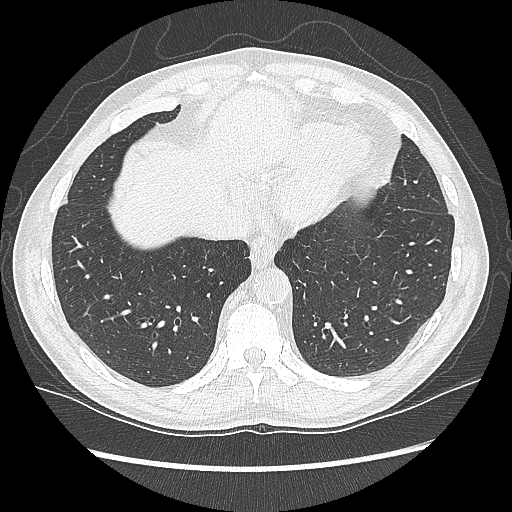
[im 86/245  lung]
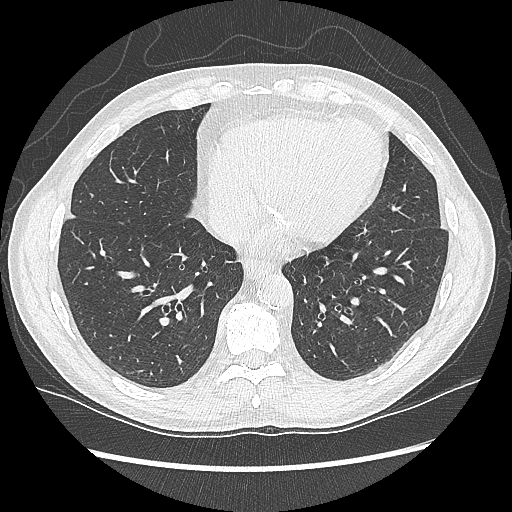
[im 110/245  lung]
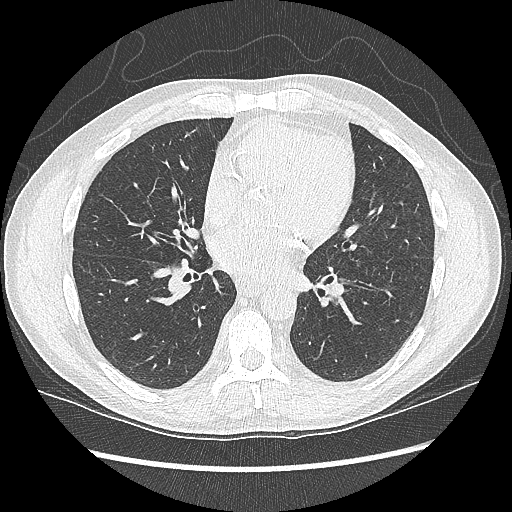
[im 123/245  lung]
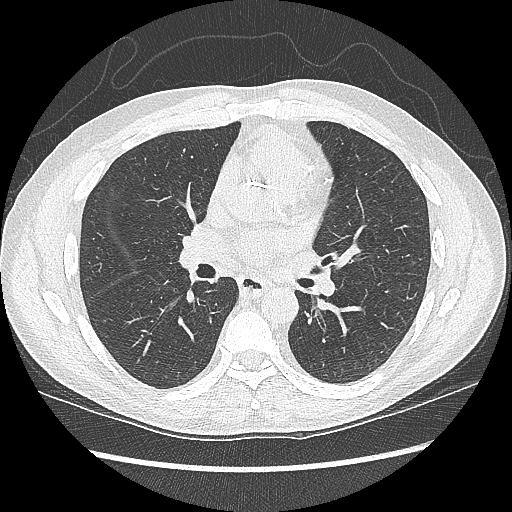
[im 135/245  mediastinal]
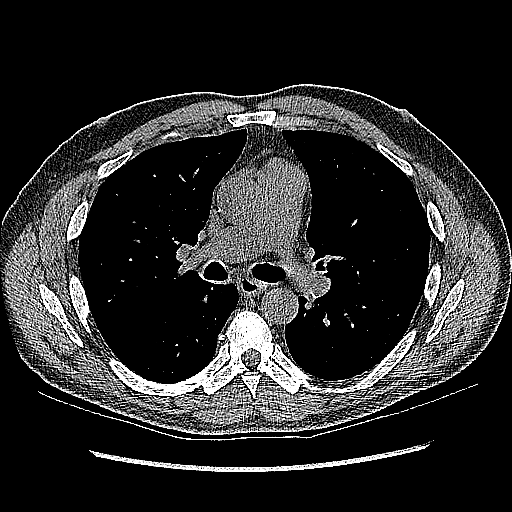
[im 135/245  lung]
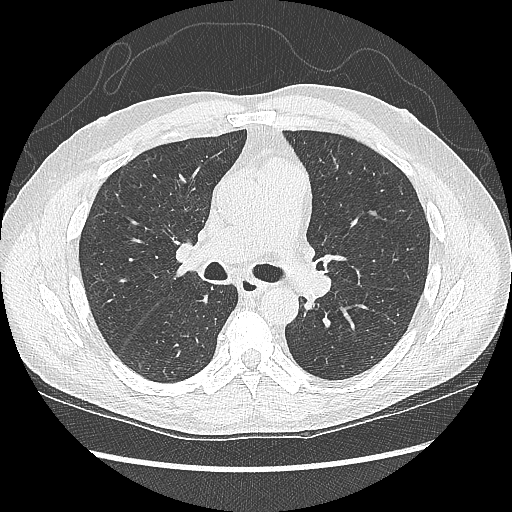
[im 159/245  lung]
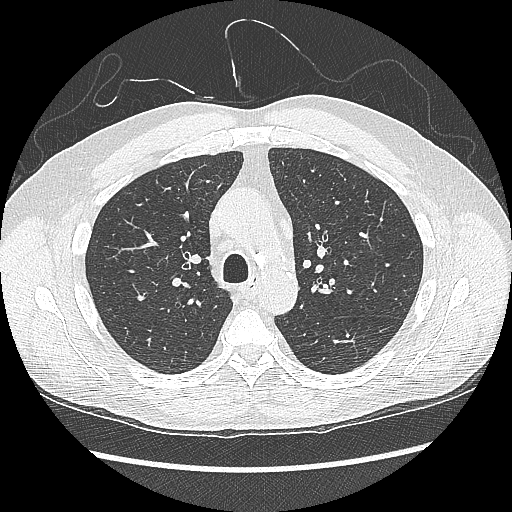
[im 171/245  lung]
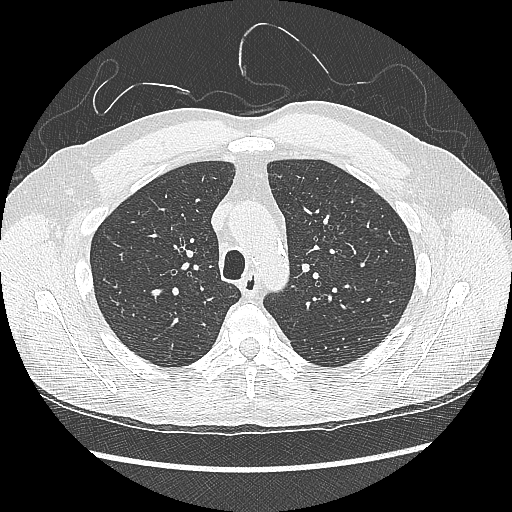
[im 184/245  lung]
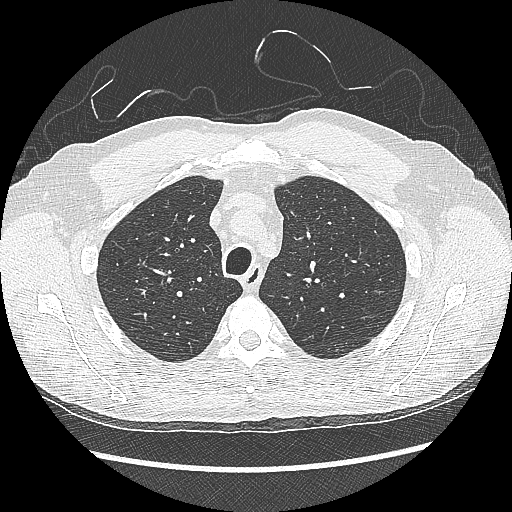
[im 196/245  mediastinal]
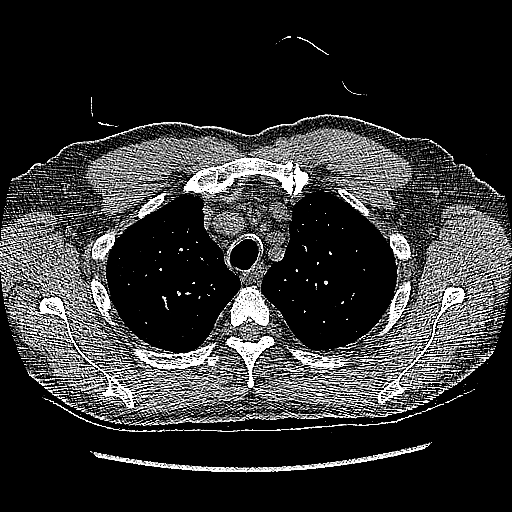
[im 196/245  lung]
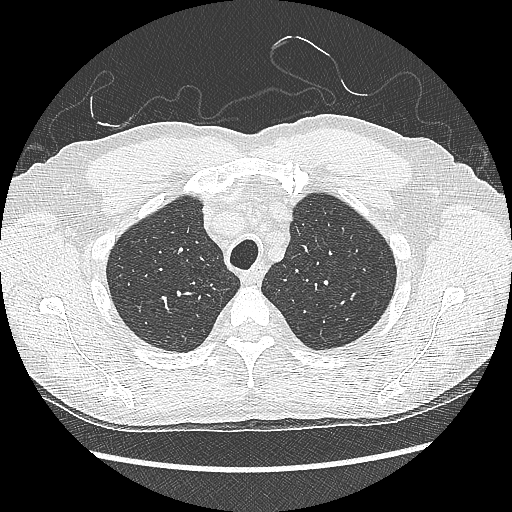
[im 220/245  lung]
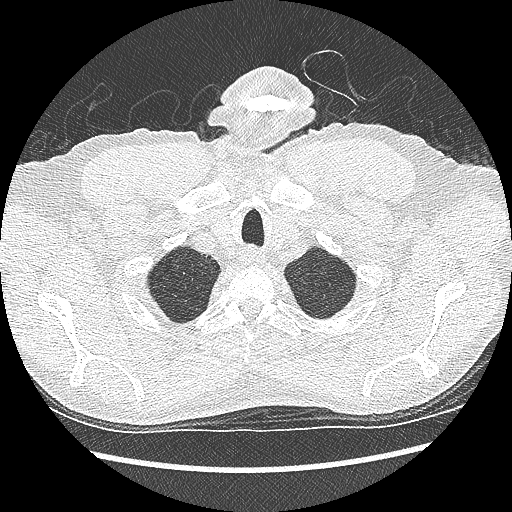
[im 232/245  lung]
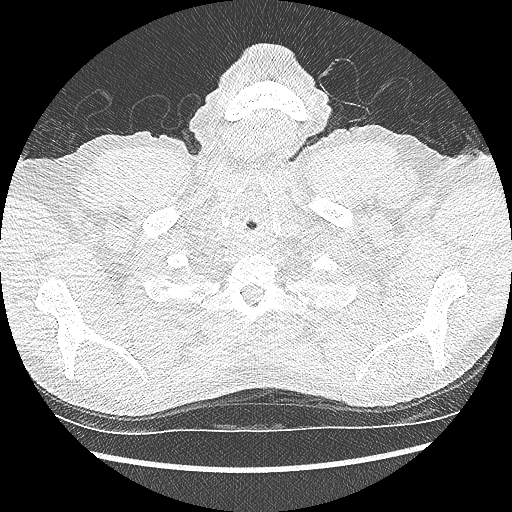

[15 of 40 positions shown; findings below may reference images not displayed]

FINDINGS: Cardiovascular: Normal heart size. No significant pericardial
fluid/thickening. Left main, left anterior descending, left
circumflex and right coronary atherosclerosis. Atherosclerotic
nonaneurysmal thoracic aorta. Normal caliber pulmonary arteries.

Mediastinum/Nodes: No discrete thyroid nodules. Unremarkable
esophagus. No pathologically enlarged axillary, mediastinal or gross
hilar lymph nodes, noting limited sensitivity for the detection of
hilar adenopathy on this noncontrast study.

Lungs/Pleura: No pneumothorax. No pleural effusion. Moderate
centrilobular emphysema and diffuse bronchial wall thickening.
Lateral segment right middle lobe pulmonary nodule measuring 3.0 mm
in volume derived mean diameter (series 3/ image 135) is stable.
Stable anterior medial basilar right upper lobe pulmonary nodule
measuring 1.1 mm in volume derived mean diameter (series 3/ image
127). No new significant pulmonary nodules.

Upper abdomen: Unremarkable.

Musculoskeletal: No aggressive appearing focal osseous lesions. Mild
thoracic spondylosis.
IMPRESSION: 1. Lung-RADS Category 2S, benign appearance or behavior. Continue
annual screening with low-dose chest CT without contrast in 12
months.
2. The "S" modifier above refers to potentially clinically
significant non lung cancer related findings. Specifically, left
main and 3 vessel coronary atherosclerosis.
3. Aortic atherosclerosis.
4. Moderate emphysema with diffuse bronchial wall thickening,
suggesting COPD.

## 2018-05-03 ENCOUNTER — Telehealth: Payer: Self-pay | Admitting: Cardiovascular Disease

## 2018-05-03 NOTE — Telephone Encounter (Signed)
Returned call to patient of Dr. Allyson Sabal who reports chest pain that started last night. He does not think it is cardiac chest pain but would like to be checked out. He has not taken NTG. He describes no other symptoms.   Offered NP appointment today but patient cannot make this. Scheduled to see MD 10/29 @ 330pm

## 2018-05-03 NOTE — Telephone Encounter (Signed)
Pt c/o of Chest Pain: STAT if CP now or developed within 24 hours  1. Are you having CP right now? NO  2. Are you experiencing any other symptoms (ex. SOB, nausea, vomiting, sweating)? NO  3. How long have you been experiencing CP? Started last night   4. Is your CP continuous or coming and going? Patient states it comes and goes.   5. Have you taken Nitroglycerin? No, patient states it's not that kind of pain to take that.  ?

## 2018-05-04 ENCOUNTER — Encounter: Payer: Self-pay | Admitting: Cardiovascular Disease

## 2018-05-04 ENCOUNTER — Ambulatory Visit (INDEPENDENT_AMBULATORY_CARE_PROVIDER_SITE_OTHER): Payer: Medicare Other | Admitting: Cardiovascular Disease

## 2018-05-04 DIAGNOSIS — I10 Essential (primary) hypertension: Secondary | ICD-10-CM | POA: Diagnosis not present

## 2018-05-04 DIAGNOSIS — Z9861 Coronary angioplasty status: Secondary | ICD-10-CM | POA: Diagnosis not present

## 2018-05-04 DIAGNOSIS — E785 Hyperlipidemia, unspecified: Secondary | ICD-10-CM

## 2018-05-04 DIAGNOSIS — I739 Peripheral vascular disease, unspecified: Secondary | ICD-10-CM

## 2018-05-04 DIAGNOSIS — I2510 Atherosclerotic heart disease of native coronary artery without angina pectoris: Secondary | ICD-10-CM

## 2018-05-04 NOTE — Patient Instructions (Signed)
Medication Instructions:  Your physician recommends that you continue on your current medications as directed. Please refer to the Current Medication list given to you today.  If you need a refill on your cardiac medications before your next appointment, please call your pharmacy.   Lab work: none If you have labs (blood work) drawn today and your tests are completely normal, you will receive your results only by: Marland Kitchen MyChart Message (if you have MyChart) OR . A paper copy in the mail If you have any lab test that is abnormal or we need to change your treatment, we will call you to review the results.  Testing/Procedures: Your physician has requested that you have a lower extremity arterial doppler- During this test, ultrasound is used to evaluate arterial blood flow in the legs. Allow approximately one hour for this exam.  Your physician has requested that you have an ankle brachial index (ABI). During this test an ultrasound and blood pressure cuff are used to evaluate the arteries that supply the arms and legs with blood. Allow thirty minutes for this exam. There are no restrictions or special instructions.  SCHEDULE IN January 2020  Your physician has requested that you have a carotid duplex. This test is an ultrasound of the carotid arteries in your neck. It looks at blood flow through these arteries that supply the brain with blood. Allow one hour for this exam. There are no restrictions or special instructions.  SCHEDULE IN July 2020   Follow-Up: At [facility], you and your health needs are our priority.  As part of our continuing mission to provide you with exceptional heart care, we have created designated Provider Care Teams.  These Care Teams include your primary Cardiologist (physician) and Advanced Practice Providers (APPs -  Physician Assistants and Nurse Practitioners) who all work together to provide you with the care you need, when you need it. You will need a follow up  appointment in 12 months.  Please call our office 2 months in advance to schedule this appointment.  You may see DR. BERRY or one of the following Advanced Practice Providers on your designated Care Team:   Corine Shelter, PA-C [place], [place] . Marjie Skiff, PA-C  Any Other Special Instructions Will Be Listed Below (If Applicable).

## 2018-05-04 NOTE — Assessment & Plan Note (Signed)
History of PAD status post peripheral intervention 02/26/2017 with covered stenting of a highly calcified left common iliac artery, atherectomy using Hawk 1 and drug-eluting balloon angioplasty of the entire right SFA which resulted in marked improvement in his claudication.  Recent Dopplers 01/12/2018 revealed stable right ABI 0.84 with a high-frequency signal in the mid right SFA although he denies recurrent claudication.

## 2018-05-04 NOTE — Assessment & Plan Note (Signed)
Discontinued tobacco abuse 2 years ago

## 2018-05-04 NOTE — Progress Notes (Signed)
05/04/2018 Austin Mclaughlin   1945/12/14  [id number]  Primary Physician Ralene Ok, MD Primary Cardiologist: [name] MD FACP, [place], [place], [place]  HPI:  Austin Mclaughlin is a 72 y.o.  married,father of 4 who I last saw in the office  08/25/2017.., He has a history of CAD, s/p cath in 2008 after a stress test for CP showed ischemia. The cath, at that time, demonstrated an occluded PDA and collaterals with high grade OM2 disease and proximal segmental AV groove left circ disease. He underwent PCI of the AV groove left circ and OM2 with DES. He had reportedly done well until developing chest pain the day prior to arrival to the ED. The CP had been intermittent for over the course of 24 hours, before recurring and prompting the patient to seek evaluation. He described the pain as a sharp, stinging pain in his left chest with no SOB, nausea or diaphoresis. At the [facility] ER he was noted to have 4mm of ST elevation in V1 and V2 and a STEMI was called and he was transferred to [facility]. On arrival to [facility], his chest pain resolved. He was taken urgently to the cath lab. The procedure was performed by Dr. Allyson Sabal via the right femoral artery. It demonstrated a patent LAD stent and diffuse RCA disease. Medical therapy was recommended. He left the cath lab in stable condition. Post-cath, he was noted to have moderate hypertension. He was placed on Lopressor, HCTZ and lisinopril. He had mild improvement in BP. The patient saw Huey B[facility] back in the office the Monday after discharge and his lisinopril was up titrated. Since that time he's had no chest pain and for some reason no longer is on an ACE inhibitor. He also has stopped smoking several months ago after smoking one pack per day for 55 years.   Since I saw him back in February he was evaluated in the emergency room at Dr. Elease Hashimoto for chest pain. His enzymes were negative and a Myoview stress test was low risk. He has had no recurrent chest pain since that time.  He does complain of some calf claudication bilaterally which is lifestyle limiting. He did have segmental pressures performed at [facility] 12/02/16 revealing a right ABI 0.54 and a left of 0.74. These did drop to .15 and .44 respectively with exercise. Based on this, I decided to proceed with duplex ultrasound and potential endovascular therapy. This was performed 01/26/17 revealing a right ABI 0.53 with a high-frequency signal of the origin of his right SFA and a left ABI 0.75 with an occluded mid left SFA.  I performed angiography and intervention on him 02/26/17. I performed a diamond echo orbital facial atherectomy followed byVBX cover stenting of a calcified high-grade left common iliac artery stenosis in addition to [facility] directional atherectomy and drug-eluting glandular plasty of his entire right SFA. His claudication resolved and his Dopplers improved.  Is recently seen in the emergency room with chest pain last week after lifting heavy objects at work. His pain has not recurred. He no longer has claudication either.  Since I saw him 8 months ago he is remained stable.  He said no recurrent claudication denies chest pain or shortness of breath.  His most recent Dopplers in July revealed a stable right ABI with moderately elevated frequency in his mid right SFA and carotid Dopplers that showed moderate right ICA stenosis.  Current Meds  Medication Sig  . amLODipine-benazepril (LOTREL) 10-40 MG capsule  Take 1 capsule by mouth daily.  Marland Kitchen aspirin EC 81 MG EC tablet Take 1 tablet (81 mg total) by mouth daily.  Marland Kitchen atorvastatin (LIPITOR) 80 MG tablet Take 80 mg by mouth daily.   . clopidogrel (PLAVIX) 75 MG tablet Take 75 mg by mouth daily.  . hydrochlorothiazide (HYDRODIURIL) 25 MG tablet Take 12.5 mg by mouth daily.   . Insulin Glargine-Lixisenatide (SOLIQUA) 100-33 UNT-MCG/ML SOPN Inject 30 Units into the skin daily.  . magnesium oxide (MAG-OX) 400 MG tablet Take 400 mg by mouth daily.    . metoprolol succinate (TOPROL-XL) 25 MG 24 hr tablet TAKE 1 TABLET(25 MG) BY MOUTH DAILY  . nitroGLYCERIN (NITROSTAT) 0.4 MG SL tablet Place 1 tablet (0.4 mg total) under the tongue every 5 (five) minutes x 3 doses as needed for chest pain.  . pantoprazole (PROTONIX) 40 MG tablet Take 1 tablet by mouth daily.  . Potassium Chloride ER 20 MEQ TBCR Take 20 mEq by mouth daily.     No Known Allergies  Social History   Socioeconomic History  . Marital status: Married    Spouse name: Not on file  . Number of children: Not on file  . Years of education: Not on file  . Highest education level: Not on file  Occupational History  . 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 Financial resource strain: Not on file  . Food insecurity:    Worry: Not on file    Inability: Not on file  . Transportation needs:    Medical: Not on file    Non-medical: Not on file  Tobacco Use  . Smoking status: Former Smoker    Packs/day: 1.00    Years: 39.00    Pack years: 39.00    Types: Cigarettes    Last attempt to quit: 04/17/2014    Years since quitting: 4.0  . Smokeless tobacco: Never Used  . Tobacco comment: every once in a while  Substance and Sexual Activity  . Alcohol use: Yes    Alcohol/week: 14.0 standard drinks    Types: 12 Cans of beer, 2 Shots of liquor per week    Comment: 02/17/2013 "6 beers/weekend day"  . Drug use: No  . Sexual activity: Yes  Lifestyle  . Physical activity:    Days per week: Not on file    Minutes per session: Not on file  . Stress: Not on file  Relationships  . Social connections:    Talks on phone: Not on file    Gets together: Not on file    Attends religious service: Not on file    Active member of club or organization: Not on file    Attends meetings of clubs or organizations: Not on file    Relationship status: Not on file  . Intimate partner violence:    Fear of current or ex partner: Not  on file    Emotionally abused: Not on file    Physically abused: Not on file    Forced sexual activity: Not on file  Other Topics Concern  . Not on file  Social History Narrative  . Not on file     Review of Systems: General: negative for chills, fever, night sweats or weight changes.  Cardiovascular: negative for chest pain, dyspnea on exertion, edema, orthopnea, palpitations, paroxysmal nocturnal dyspnea or shortness of breath Dermatological: negative for rash Respiratory: negative for cough or wheezing Urologic: negative for hematuria Abdominal:  negative for nausea, vomiting, diarrhea, bright red blood per rectum, melena, or hematemesis Neurologic: negative for visual changes, syncope, or dizziness All other systems reviewed and are otherwise negative except as noted above.    Blood pressure (!) 142/60, pulse 63, height 5\' 10"  (1.778 m), weight 175 lb (79.4 kg).  General appearance: alert and no distress Neck: no adenopathy, no JVD, supple, symmetrical, trachea midline, thyroid not enlarged, symmetric, no tenderness/mass/nodules and Soft left carotid bruit Lungs: clear to auscultation bilaterally Heart: regular rate and rhythm, S1, S2 normal, no murmur, click, rub or gallop Extremities: extremities normal, atraumatic, no cyanosis or edema Pulses: Diminished pedal pulses bilaterally Skin: Skin color, texture, turgor normal. No rashes or lesions Neurologic: Alert and oriented X 3, normal strength and tone. Normal symmetric reflexes. Normal coordination and gait  EKG sinus rhythm at 63 without ST or T wave changes.  I personally reviewed this EKG.  ASSESSMENT AND PLAN:   HTN (hypertension) History of essential hypertension her blood pressure measured at 142/60.  He is on Lotrel, metoprolol and hydrochlorothiazide.  CAD S/P percutaneous coronary angioplasty History of CAD status post cath in 2008 after an abnormal stress test for chest pain history of ischemia at that time the  cath revealed an occluded PDA with collaterals and high-grade OM 2 disease and proximal segmental AV groove circumflex disease.  Underwent PCI and stenting of his AV groove circumflex and OM 2 with drug-eluting stents.  He had "a STEMI" after that during the episode of chest pain that showed a patent stent diffuse RCA disease and medical therapy was recommended.  He denies chest pain or shortness of breath.  Tobacco abuse Discontinued tobacco abuse 2 years ago  Dyslipidemia, goal LDL below 70 History of dyslipidemia on statin therapy followed by his PCP  PAD (peripheral artery disease) (HCC) History of PAD status post peripheral intervention 02/26/2017 with covered stenting of a highly calcified left common iliac artery, atherectomy using Hawk 1 and drug-eluting balloon angioplasty of the entire right SFA which resulted in marked improvement in his claudication.  Recent Dopplers 01/12/2018 revealed stable right ABI 0.84 with a high-frequency signal in the mid right SFA although he denies recurrent claudication.      [name] MD FACP,FACC,FAHA, [facility] 05/04/2018 3:35 PM

## 2018-05-04 NOTE — Assessment & Plan Note (Signed)
History of essential hypertension her blood pressure measured at 142/60.  He is on Lotrel, metoprolol and hydrochlorothiazide.

## 2018-05-04 NOTE — Assessment & Plan Note (Signed)
History of CAD status post cath in 2008 after an abnormal stress test for chest pain history of ischemia at that time the cath revealed an occluded PDA with collaterals and high-grade OM 2 disease and proximal segmental AV groove circumflex disease.  Underwent PCI and stenting of his AV groove circumflex and OM 2 with drug-eluting stents.  He had "a STEMI" after that during the episode of chest pain that showed a patent stent diffuse RCA disease and medical therapy was recommended.  He denies chest pain or shortness of breath.

## 2018-05-04 NOTE — Assessment & Plan Note (Signed)
History of dyslipidemia on statin therapy followed by his PCP 

## 2018-07-30 ENCOUNTER — Inpatient Hospital Stay (HOSPITAL_COMMUNITY): Admission: RE | Admit: 2018-07-30 | Payer: Medicare Other | Source: Ambulatory Visit

## 2018-11-08 ENCOUNTER — Other Ambulatory Visit: Payer: Self-pay | Admitting: Cardiovascular Disease

## 2018-11-08 DIAGNOSIS — I739 Peripheral vascular disease, unspecified: Secondary | ICD-10-CM

## 2018-11-18 ENCOUNTER — Telehealth: Payer: Self-pay

## 2018-11-18 NOTE — Telephone Encounter (Signed)
Dr. Allyson Sabal, Can Plavix be held for colonoscopy?  Please route response back to P CV DIV PREOP  Thanks, Coralee North

## 2018-11-18 NOTE — Telephone Encounter (Signed)
   [facility] Pre-operative Risk Assessment    Request for surgical clearance:  1. What type of surgery is being performed? Colonoscopy   2. When is this surgery scheduled? 12/02/2018  What type of clearance is required (medical clearance vs. Pharmacy clearance to hold med vs. Both)? Both  3. Are there any medications that need to be held prior to surgery and how long? plavix  4. Practice name and name of physician performing surgery? Dr. Benson Norway  5. What is your office phone number [phone]   7.   What is your office fax number [phone]  8.   Anesthesia type (None, local, MAC, general) ? Propofol   Austin Mclaughlin Austin Mclaughlin 11/18/2018, 11:42 AM  _________________________________________________________________   (provider comments below)

## 2018-11-19 NOTE — Telephone Encounter (Signed)
   Primary Cardiologist: Nanetta Batty, MD  Chart reviewed as part of pre-operative protocol coverage. Patient was contacted 11/19/2018 in reference to pre-operative risk assessment for pending surgery as outlined below.  Austin Mclaughlin was last seen on 05/04/2018 by Dr. Allyson Sabal.  Since that day, Austin Mclaughlin has done well with no new cardiac complaints.  Per Dr. Allyson Sabal it is OK to interrupt antiplatelet therapy for colonoscopy.   Therefore, based on ACC/AHA guidelines, the patient would be at acceptable risk for the planned procedure without further cardiovascular testing.   I will route this recommendation to the requesting party via Epic fax function and remove from pre-op pool.  Please call with questions.  Berton Bon, NP 11/19/2018, 11:56 AM

## 2018-11-19 NOTE — Telephone Encounter (Signed)
Okay to interrupt antiplatelet therapy for endoscopy 

## 2019-01-28 ENCOUNTER — Ambulatory Visit (HOSPITAL_BASED_OUTPATIENT_CLINIC_OR_DEPARTMENT_OTHER)
Admission: RE | Admit: 2019-01-28 | Discharge: 2019-01-28 | Disposition: A | Discharge status: Home / Self Care | Payer: Medicare Other | Source: Ambulatory Visit | Attending: Cardiovascular Disease | Admitting: Cardiovascular Disease

## 2019-01-28 ENCOUNTER — Other Ambulatory Visit (HOSPITAL_COMMUNITY): Payer: Self-pay | Admitting: Cardiovascular Disease

## 2019-01-28 ENCOUNTER — Other Ambulatory Visit: Payer: Self-pay

## 2019-01-28 ENCOUNTER — Ambulatory Visit (HOSPITAL_COMMUNITY)
Admission: RE | Admit: 2019-01-28 | Discharge: 2019-01-28 | Disposition: A | Discharge status: Home / Self Care | Payer: Medicare Other | Source: Ambulatory Visit | Attending: Internal Medicine | Admitting: Internal Medicine

## 2019-01-28 DIAGNOSIS — I6523 Occlusion and stenosis of bilateral carotid arteries: Secondary | ICD-10-CM

## 2019-01-28 DIAGNOSIS — I739 Peripheral vascular disease, unspecified: Secondary | ICD-10-CM

## 2019-01-28 DIAGNOSIS — Z9862 Peripheral vascular angioplasty status: Secondary | ICD-10-CM

## 2019-01-28 DIAGNOSIS — Z95828 Presence of other vascular implants and grafts: Secondary | ICD-10-CM

## 2019-09-07 ENCOUNTER — Telehealth: Payer: Medicare Other | Admitting: Cardiology

## 2019-09-23 ENCOUNTER — Ambulatory Visit (INDEPENDENT_AMBULATORY_CARE_PROVIDER_SITE_OTHER): Payer: Self-pay | Admitting: Cardiovascular Disease

## 2019-09-23 ENCOUNTER — Encounter (INDEPENDENT_AMBULATORY_CARE_PROVIDER_SITE_OTHER): Payer: Self-pay

## 2019-09-23 ENCOUNTER — Encounter: Payer: Self-pay | Admitting: Cardiovascular Disease

## 2019-09-23 ENCOUNTER — Other Ambulatory Visit: Payer: Self-pay

## 2019-09-23 VITALS — BP 138/62 | HR 64 | Ht 69.0 in | Wt 175.0 lb

## 2019-09-23 DIAGNOSIS — I779 Disorder of arteries and arterioles, unspecified: Secondary | ICD-10-CM | POA: Insufficient documentation

## 2019-09-23 DIAGNOSIS — I6521 Occlusion and stenosis of right carotid artery: Secondary | ICD-10-CM

## 2019-09-23 DIAGNOSIS — Z720 Tobacco use: Secondary | ICD-10-CM

## 2019-09-23 DIAGNOSIS — Z9861 Coronary angioplasty status: Secondary | ICD-10-CM

## 2019-09-23 DIAGNOSIS — E785 Hyperlipidemia, unspecified: Secondary | ICD-10-CM

## 2019-09-23 DIAGNOSIS — I739 Peripheral vascular disease, unspecified: Secondary | ICD-10-CM

## 2019-09-23 DIAGNOSIS — I2510 Atherosclerotic heart disease of native coronary artery without angina pectoris: Secondary | ICD-10-CM

## 2019-09-23 LAB — LIPID PANEL
Chol/HDL Ratio: 3 ratio (ref 0.0–5.0)
Cholesterol, Total: 154 mg/dL (ref 100–199)
HDL: 51 mg/dL (ref >39)
LDL Chol Calc (NIH): 92 mg/dL (ref 0–99)
Triglycerides: 54 mg/dL (ref 0–149)
VLDL Cholesterol Cal: 11 mg/dL (ref 5–40)

## 2019-09-23 NOTE — Assessment & Plan Note (Signed)
History of peripheral arterial disease status post angiography by myself 02/26/2017 revealing a high-grade calcified proximal left common iliac artery stenosis.  I performed orbital atherectomy, and VBX covered stenting.  I also performed atherectomy and drug-coated balloon angioplasty of long diffusely diseased right SFA from the origin down to the adductor canal.  He had one-vessel runoff via peroneal.  He did have a total left SFA with two-vessel runoff although he denies claudication and this has not been intervened on.  His most recent lower extremity arterial Doppler studies performed 01/28/2019 revealed a patent left iliac stent with ABIs in the 0.7 range bilaterally, widely patent right SFA.

## 2019-09-23 NOTE — Assessment & Plan Note (Signed)
History of discontinued tobacco abuse in 2008 at the time of his intervention.

## 2019-09-23 NOTE — Assessment & Plan Note (Signed)
History of essential hypertension with blood pressure measured today at 138/62.  He is on amlodipine, benazepril, hydrochlorothiazide and metoprolol.

## 2019-09-23 NOTE — Progress Notes (Signed)
09/23/2019 Austin Mclaughlin   May 23, 1946  [id number]  Primary Physician Ralene Ok, MD Primary Cardiologist: [name] MD FACP, [place], [place], [place]  HPI:  Austin Mclaughlin is a 74 y.o.  [name] [employer] who I last saw in the office  05/04/2018. He has a history of CAD, s/p cath in 2008 after a stress test for CP showed ischemia. The cath, at that time, demonstrated an occluded PDA and collaterals with high grade OM2 disease and proximal segmental AV groove left circ disease. He underwent PCI of the AV groove left circ and OM2 with DES. He had reportedly done well until developing chest pain the day prior to arrival to the ED. The CP had been intermittent for over the course of 24 hours, before recurring and prompting the patient to seek evaluation. He described the pain as a sharp, stinging pain in his left chest with no SOB, nausea or diaphoresis. At the [facility] ER he was noted to have 59mm of ST elevation in V1 and V2 and a STEMI was called and he was transferred to [facility] On arrival to [facility], his chest pain resolved. He was taken urgently to the cath lab. The procedure was performed by Dr. Allyson Sabal via the right femoral artery. It demonstrated a patent LAD stent and diffuse RCA disease. Medical therapy was recommended. He left the cath lab in stable condition. Post-cath, he was noted to have moderate hypertension. He was placed on Lopressor, HCTZ and lisinopril. He had mild improvement in BP. The patient saw [name] back in the office the Monday after discharge and his lisinopril was up titrated. Since that time he's had no chest pain and for some reason no longer is on an ACE inhibitor. He also has stopped smoking several months ago after smoking one pack per day for 55 years.   Since I saw him back in February he was evaluated in the emergency room at Dr. Elease Hashimoto for chest pain. His enzymes were negative and a Myoview stress test was low risk. He has had no recurrent chest pain since that time.  He does complain of some calf claudication bilaterally which is lifestyle limiting. He did have segmental pressures performed at [facility] 12/02/16 revealing a right ABI 0.54 and a left of 0.74. These did drop to .15 and .44 respectively with exercise. Based on this, I decided to proceed with duplex ultrasound and potential endovascular therapy. This was performed 01/26/17 revealing a right ABI 0.53 with a high-frequency signal of the origin of his right SFA and a left ABI 0.75 with an occluded mid left SFA.  I performed angiography and intervention on him 02/26/17. I performed a diamond echo orbital facial atherectomy followed byVBX cover stenting of a calcified high-grade left common iliac artery stenosis in addition to [facility] directional atherectomy and drug-eluting glandular plasty of his entire right SFA. His claudication resolved and his Dopplers improved.  Is recently seen in the emergency room with chest pain last week after lifting heavy objects at work. His pain has not recurred. He no longer has claudication either.  Since I saw him in the office over 1 year ago he continues to deny chest pain, shortness of breath or claudication.  His Dopplers July 2020 suggested a patent left iliac stent, patent right SFA with ABIs in the 0.7 range bilaterally.  Carotid Dopplers showed moderate right ICA stenosis..   Current Meds  Medication Sig  . amLODipine-benazepril (LOTREL) 10-40 MG capsule Take 1  capsule by mouth daily.  Marland Kitchen aspirin EC 81 MG EC tablet Take 1 tablet (81 mg total) by mouth daily.  Marland Kitchen atorvastatin (LIPITOR) 80 MG tablet Take 80 mg by mouth daily.   . clopidogrel (PLAVIX) 75 MG tablet Take 75 mg by mouth daily.  . hydrochlorothiazide (HYDRODIURIL) 25 MG tablet Take 12.5 mg by mouth daily.   . Insulin Glargine-Lixisenatide (SOLIQUA) 100-33 UNT-MCG/ML SOPN Inject 30 Units into the skin daily.  . magnesium oxide (MAG-OX) 400 MG tablet Take 400 mg by mouth daily.  . metoprolol  succinate (TOPROL-XL) 25 MG 24 hr tablet TAKE 1 TABLET(25 MG) BY MOUTH DAILY  . nitroGLYCERIN (NITROSTAT) 0.4 MG SL tablet Place 1 tablet (0.4 mg total) under the tongue every 5 (five) minutes x 3 doses as needed for chest pain.  . pantoprazole (PROTONIX) 40 MG tablet Take 1 tablet by mouth daily.  . Potassium Chloride ER 20 MEQ TBCR Take 20 mEq by mouth daily.     No Known Allergies  Social History   Socioeconomic History  . Marital status: Married    Spouse name: Not on file  . Number of children: Not on file  . Years of education: Not on file  . Highest education level: Not on file  Occupational History  . 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 Smoking status: Former Smoker    Packs/day: 1.00    Years: 39.00    Pack years: 39.00    Types: Cigarettes    Quit date: 04/17/2014    Years since quitting: 5.4  . Smokeless tobacco: Never Used  . Tobacco comment: every once in a while  Substance and Sexual Activity  . Alcohol use: Yes    Alcohol/week: 14.0 standard drinks    Types: 12 Cans of beer, 2 Shots of liquor per week    Comment: 02/17/2013 "6 beers/weekend day"  . Drug use: No  . Sexual activity: Yes  Other Topics Concern  . Not on file  Social History Narrative  . Not on file   Social Determinants of Health   Financial Resource Strain:   . Difficulty of Paying Living Expenses:   Food Insecurity:   . Worried About Charity fundraiser in the Last Year:   . Arboriculturist in the Last Year:   Transportation Needs:   . Film/video editor (Medical):   Marland Kitchen Lack of Transportation (Non-Medical):   Physical Activity:   . Days of Exercise per Week:   . Minutes of Exercise per Session:   Stress:   . Feeling of Stress :   Social Connections:   . Frequency of Communication with Friends and Family:   . Frequency of Social Gatherings with Friends and Family:   . Attends Religious Services:   . Active  Member of Clubs or Organizations:   . Attends Archivist Meetings:   Marland Kitchen Marital Status:   Intimate Partner Violence:   . Fear of Current or Ex-Partner:   . Emotionally Abused:   Marland Kitchen Physically Abused:   . Sexually Abused:      Review of Systems: General: negative for chills, fever, night sweats or weight changes.  Cardiovascular: negative for chest pain, dyspnea on exertion, edema, orthopnea, palpitations, paroxysmal nocturnal dyspnea or shortness of breath Dermatological: negative for rash Respiratory: negative for cough or wheezing Urologic: negative for hematuria Abdominal: negative for nausea, vomiting, diarrhea, bright red blood per rectum, melena,  or hematemesis Neurologic: negative for visual changes, syncope, or dizziness All other systems reviewed and are otherwise negative except as noted above.    Blood pressure 138/62, pulse 64, height 5\' 9"  (1.753 m), weight 175 lb (79.4 kg).  General appearance: alert and no distress Neck: no adenopathy, no carotid bruit, no JVD, supple, symmetrical, trachea midline and thyroid not enlarged, symmetric, no tenderness/mass/nodules Lungs: clear to auscultation bilaterally Heart: regular rate and rhythm, S1, S2 normal, no murmur, click, rub or gallop Extremities: extremities normal, atraumatic, no cyanosis or edema Pulses: 2+ and symmetric Skin: Skin color, texture, turgor normal. No rashes or lesions Neurologic: Alert and oriented X 3, normal strength and tone. Normal symmetric reflexes. Normal coordination and gait  EKG normal sinus rhythm at 64 without ST or T wave changes.  I personally reviewed this EKG.  ASSESSMENT AND PLAN:   HTN (hypertension) History of essential hypertension with blood pressure measured today at 138/62.  He is on amlodipine, benazepril, hydrochlorothiazide and metoprolol.  CAD S/P percutaneous coronary angioplasty History of CAD status post cath in 08 with demonstration of an occluded PDA with  collaterals and high-grade OM 2 disease as well as proximal AV groove circumflex.  He underwent PCI and stenting of OM2.  He had relook in 2014 in the setting of "STEMI" but his stents were patent.  He did have a Myoview in 2018 which was nonischemic.  He denies chest pain.  Tobacco abuse History of discontinued tobacco abuse in 2008 at the time of his intervention.  Dyslipidemia, goal LDL below 70 She has dyslipidemia on high-dose statin therapy with lipid profile performed 12/05/2016 revealing total cholesterol of 116, LDL 71 and HDL 36.  We will check a fasting lipid liver profile today.  PAD (peripheral artery disease) (HCC) History of peripheral arterial disease status post angiography by myself 02/26/2017 revealing a high-grade calcified proximal left common iliac artery stenosis.  I performed orbital atherectomy, and VBX covered stenting.  I also performed atherectomy and drug-coated balloon angioplasty of long diffusely diseased right SFA from the origin down to the adductor canal.  He had one-vessel runoff via peroneal.  He did have a total left SFA with two-vessel runoff although he denies claudication and this has not been intervened on.  His most recent lower extremity arterial Doppler studies performed 01/28/2019 revealed a patent left iliac stent with ABIs in the 0.7 range bilaterally, widely patent right SFA.  Carotid artery disease (HCC) Recent carotid Dopplers performed 01/28/2019 revealed moderate right ICA stenosis.  He is neurologically asymptomatic.  We will repeat carotid Dopplers this July.      August MD FACP,FACC,FAHA, [facility] 09/23/2019 9:47 AM

## 2019-09-23 NOTE — Assessment & Plan Note (Signed)
She has dyslipidemia on high-dose statin therapy with lipid profile performed 12/05/2016 revealing total cholesterol of 116, LDL 71 and HDL 36.  We will check a fasting lipid liver profile today.

## 2019-09-23 NOTE — Assessment & Plan Note (Signed)
Recent carotid Dopplers performed 01/28/2019 revealed moderate right ICA stenosis.  He is neurologically asymptomatic.  We will repeat carotid Dopplers this July.

## 2019-09-23 NOTE — Patient Instructions (Signed)
Medication Instructions:  NO CHANGE *If you need a refill on your cardiac medications before your next appointment, please call your pharmacy*   Lab Work: Your physician recommends that you HAVE LAB WORK TODAY If you have labs (blood work) drawn today and your tests are completely normal, you will receive your results only by: Marland Kitchen MyChart Message (if you have MyChart) OR . A paper copy in the mail If you have any lab test that is abnormal or we need to change your treatment, we will call you to review the results.   Testing/Procedures: Your physician has requested that you have a carotid duplex. This test is an ultrasound of the carotid arteries in your neck. It looks at blood flow through these arteries that supply the brain with blood. Allow one hour for this exam. There are no restrictions or special instructions.SCHEDULE IN July  Your physician has requested that you have a lower extremity arterial duplex. During this test, ultrasound are used to evaluate arterial blood flow in the legs. Allow one hour for this exam. There are no restrictions or special instructions. SCHEDULE IN JULY     Follow-Up: At [facility], you and your health needs are our priority.  As part of our continuing mission to provide you with exceptional heart care, we have created designated Provider Care Teams.  These Care Teams include your primary Cardiologist (physician) and Advanced Practice Providers (APPs -  Physician Assistants and Nurse Practitioners) who all work together to provide you with the care you need, when you need it.  We recommend signing up for the patient portal called "MyChart".  Sign up information is provided on this After Visit Summary.  MyChart is used to connect with patients for Virtual Visits (Telemedicine).  Patients are able to view lab/test results, encounter notes, upcoming appointments, etc.  Non-urgent messages can be sent to your provider as well.   To learn more about what you  can do with MyChart, go to [email or web address].    Your next appointment:   12 month(s)  The format for your next appointment:   In Person  Provider:   You may see Nanetta Batty, MD or one of the following Advanced Practice Providers on your designated Care Team:    Corine Shelter, PA-C  [place], [place]  Edd Fabian, [place]

## 2019-09-23 NOTE — Assessment & Plan Note (Signed)
History of CAD status post cath in 08 with demonstration of an occluded PDA with collaterals and high-grade OM 2 disease as well as proximal AV groove circumflex.  He underwent PCI and stenting of OM2.  He had relook in 2014 in the setting of "STEMI" but his stents were patent.  He did have a Myoview in 2018 which was nonischemic.  He denies chest pain.

## 2019-10-04 ENCOUNTER — Other Ambulatory Visit: Payer: Self-pay

## 2019-10-04 DIAGNOSIS — Z9861 Coronary angioplasty status: Secondary | ICD-10-CM

## 2019-10-04 DIAGNOSIS — I2510 Atherosclerotic heart disease of native coronary artery without angina pectoris: Secondary | ICD-10-CM

## 2019-10-04 DIAGNOSIS — E785 Hyperlipidemia, unspecified: Secondary | ICD-10-CM

## 2019-10-04 MED ORDER — EZETIMIBE 10 MG PO TABS: 10.0000 mg | ORAL_TABLET | Freq: Every day | ORAL | 3 refills | Status: DC

## 2019-10-04 NOTE — Progress Notes (Signed)
zetia 

## 2019-10-04 NOTE — Progress Notes (Signed)
lipid

## 2020-02-02 ENCOUNTER — Other Ambulatory Visit: Payer: Self-pay

## 2020-02-02 ENCOUNTER — Ambulatory Visit (HOSPITAL_COMMUNITY)
Admission: RE | Admit: 2020-02-02 | Discharge: 2020-02-02 | Disposition: A | Discharge status: Home / Self Care | Payer: Medicare PPO | Source: Ambulatory Visit | Attending: Cardiovascular Disease | Admitting: Cardiovascular Disease

## 2020-02-02 ENCOUNTER — Other Ambulatory Visit (HOSPITAL_COMMUNITY): Payer: Self-pay | Admitting: Cardiovascular Disease

## 2020-02-02 DIAGNOSIS — Z9862 Peripheral vascular angioplasty status: Secondary | ICD-10-CM

## 2020-02-02 DIAGNOSIS — I6523 Occlusion and stenosis of bilateral carotid arteries: Secondary | ICD-10-CM

## 2020-02-02 DIAGNOSIS — I739 Peripheral vascular disease, unspecified: Secondary | ICD-10-CM

## 2020-02-02 DIAGNOSIS — Z95828 Presence of other vascular implants and grafts: Secondary | ICD-10-CM

## 2020-02-02 DIAGNOSIS — I6521 Occlusion and stenosis of right carotid artery: Secondary | ICD-10-CM

## 2020-02-02 NOTE — Progress Notes (Signed)
Called patient left carotid doppler results on personal voice mail.Advised to repeat in 12 months. 

## 2020-02-09 ENCOUNTER — Other Ambulatory Visit: Payer: Self-pay

## 2020-02-09 DIAGNOSIS — I739 Peripheral vascular disease, unspecified: Secondary | ICD-10-CM

## 2020-02-17 ENCOUNTER — Other Ambulatory Visit: Payer: Self-pay | Admitting: Internal Medicine

## 2020-02-17 DIAGNOSIS — F17210 Nicotine dependence, cigarettes, uncomplicated: Secondary | ICD-10-CM

## 2020-02-28 ENCOUNTER — Ambulatory Visit
Admission: RE | Admit: 2020-02-28 | Discharge: 2020-02-28 | Disposition: A | Discharge status: Home / Self Care | Payer: Medicare PPO | Source: Ambulatory Visit | Attending: Internal Medicine | Admitting: Internal Medicine

## 2020-02-28 DIAGNOSIS — F17210 Nicotine dependence, cigarettes, uncomplicated: Secondary | ICD-10-CM

## 2020-06-08 ENCOUNTER — Telehealth: Payer: Self-pay | Admitting: Cardiovascular Disease

## 2020-06-08 NOTE — Telephone Encounter (Signed)
Patient says he feels like there is something in the back of his throat that is running down to his chest. States it started off as something like a bad cold but he doesn't think that's it because it runs down the left side of his chest. Also complained that his heart feels like it speeds up at random every few days.   Patient c/o Palpitations:  High priority if patient c/o lightheadedness, shortness of breath, or chest pain  1) How long have you had palpitations/irregular HR/ Afib? Are you having the symptoms now?  Says he feels like his heart speeds up sometimes, usually every few days, hasn't happened since last week   2) Are you currently experiencing lightheadedness, SOB or CP? No   3) Do you have a history of afib (atrial fibrillation) or irregular heart rhythm?  No   4) Have you checked your BP or HR? (document readings if available):  no  5) Are you experiencing any other symptoms?  No   Patient would like to know if he should come in to see Dr. Allyson Sabal prior to his one year follow up in March with the above symptoms. Please call  Thank you!

## 2020-06-08 NOTE — Telephone Encounter (Signed)
Called and spoke with patient. He states he has been having intermittent discomfort in his throat since last week. He states it feels like when you get a cold. He denies any other cold like symptoms such cough, nasal congestion, post nasal drainage. He describes the pain a burning sensation in his throat that last for a few seconds and then goes away. One time it seemed to move to left shoulder. Does not seem to be associated with activity. He denies any history of reflux. No chest pain, shortness of breath, nausea, vomiting, or diaphoresis. He also notes that he has had a few episodes where his heart "speeds up" for a few seconds but no lightheadedness, dizziness, syncope.   Does not sounds like cardiac pain. However, patient states he is concerned about any pain near his heart and was wondering if he should have an appointment before annual visit in March. Was able to schedule him an appointment with Corine Shelter, PA-C, on 07/05/2020.   Patient will let us know if episodes occur more frequently or last longer. And he was advised to go to the ED if he were to develop chest pain, acute shortness of breath, persistent/prolonged palpitations, near syncope/sycnope prior to visit. He voice understanding and agreed.  Corrin Parker, PA-C 06/08/2020 8:35 AM

## 2020-07-05 ENCOUNTER — Other Ambulatory Visit: Payer: Self-pay

## 2020-07-05 ENCOUNTER — Encounter: Payer: Self-pay | Admitting: Cardiology

## 2020-07-05 ENCOUNTER — Ambulatory Visit (INDEPENDENT_AMBULATORY_CARE_PROVIDER_SITE_OTHER): Payer: Medicare PPO | Admitting: Cardiology

## 2020-07-05 VITALS — BP 136/60 | HR 86 | Ht 66.0 in | Wt 180.0 lb

## 2020-07-05 DIAGNOSIS — I739 Peripheral vascular disease, unspecified: Secondary | ICD-10-CM

## 2020-07-05 DIAGNOSIS — E785 Hyperlipidemia, unspecified: Secondary | ICD-10-CM

## 2020-07-05 DIAGNOSIS — I2510 Atherosclerotic heart disease of native coronary artery without angina pectoris: Secondary | ICD-10-CM

## 2020-07-05 DIAGNOSIS — I10 Essential (primary) hypertension: Secondary | ICD-10-CM

## 2020-07-05 DIAGNOSIS — R002 Palpitations: Secondary | ICD-10-CM

## 2020-07-05 DIAGNOSIS — Z9861 Coronary angioplasty status: Secondary | ICD-10-CM

## 2020-07-05 NOTE — Assessment & Plan Note (Signed)
B/P by me 136/60- he has not taken his medications yet today

## 2020-07-05 NOTE — Patient Instructions (Signed)
Medication Instructions:  Continue current medications  *If you need a refill on your cardiac medications before your next appointment, please call your pharmacy*   Lab Work: None Ordered   Testing/Procedures: None ordered   Follow-Up: At [facility], you and your health needs are our priority.  As part of our continuing mission to provide you with exceptional heart care, we have created designated Provider Care Teams.  These Care Teams include your primary Cardiologist (physician) and Advanced Practice Providers (APPs -  Physician Assistants and Nurse Practitioners) who all work together to provide you with the care you need, when you need it.  We recommend signing up for the patient portal called "MyChart".  Sign up information is provided on this After Visit Summary.  MyChart is used to connect with patients for Virtual Visits (Telemedicine).  Patients are able to view lab/test results, encounter notes, upcoming appointments, etc.  Non-urgent messages can be sent to your provider as well.   To learn more about what you can do with MyChart, go to [email or web address].    Your next appointment:   6 month(s)  The format for your next appointment:   In Person  Provider:   You may see Nanetta Batty, MD or one of the following Advanced Practice Providers on your designated Care Team:    Corine Shelter, PA-C  [place], [place]  Edd Fabian, [place]

## 2020-07-05 NOTE — Assessment & Plan Note (Signed)
One episode of breif tachycardia noted in Nov- none since

## 2020-07-05 NOTE — Assessment & Plan Note (Signed)
On high dose statin Rx- PCP follows 

## 2020-07-05 NOTE — Assessment & Plan Note (Signed)
RSFA and LCIA PTA Aug 2018 Moderate RICA stenosis, mild LICA stenosis July 2021

## 2020-07-05 NOTE — Progress Notes (Signed)
Cardiology Office Note:    Date:  07/05/2020   ID:  Austin Mclaughlin, DOB [birth year], MRN [id number]  PCP:  Ralene Ok, MD  Cardiologist:  Nanetta Batty, MD  Electrophysiologist:  None   Referring MD: Ralene Ok, MD   CC: palpitations  History of Present Illness:    Austin Mclaughlin is a 74 y.o. male with a hx of CAD, s/p CFX AV and OM2 PCI with DES in 2008. He had a cath in 2014 in the setting of a (?) STEMI. I could not find the actual cath report but he apparently did not have a PCI.  He was admittedin in June of 2018 with chest pain.  Myoview then was low risk. In Aug 2018 he had a RSFA PTA and LCIA PTA. He has done well since.   He is in the office today after he had an episode of palpitations. This was actually back in November. He said he felt like his heart rate was speeding up, this lasted about a minute. He is not noted any recurrent episodes. He is not had chest pain. He says he walks a mile a day with his 2 granddaughters. He denies any claudication.   Past Medical History:  Diagnosis Date  . Arthritis    "all over" (02/17/2013)  . Chronic lower back pain   . Coronary artery disease   . GERD (gastroesophageal reflux disease)   . High cholesterol   . Hypertension   . Myocardial infarction [facility]) 2008   stents to AV CFX & OM  . Type II diabetes mellitus (HCC)     Past Surgical History:  Procedure Laterality Date  . CARDIAC CATHETERIZATION  02/15/2013  . CORONARY ANGIOPLASTY WITH STENT PLACEMENT  2008   DES AV CFX & OM2  . LOWER EXTREMITY ANGIOGRAPHY Bilateral 02/26/2017   Procedure: Lower Extremity Angiography;  Surgeon: [name], MD;  Location: [facility] INVASIVE CV LAB;  Service: Cardiovascular;  Laterality: Bilateral;  . NM MYOVIEW LTD  2016   Normal  . PERIPHERAL VASCULAR ATHERECTOMY Right 02/26/2017   Procedure: PERIPHERAL VASCULAR ATHERECTOMY;  Surgeon: [name], MD;  Location: [facility] INVASIVE CV LAB;  Service: Cardiovascular;  Laterality: Right;  SFA left  common iliac athrectomy   . PERIPHERAL VASCULAR INTERVENTION Left 02/26/2017   Procedure: PERIPHERAL VASCULAR INTERVENTION;  Surgeon: [name], MD;  Location[facility] INVASIVE CV LAB;  Service: Cardiovascular;  Laterality: Left;  left common iliac    Current Medications: Current Meds  Medication Sig  . amLODipine-benazepril (LOTREL) 10-40 MG capsule Take 1 capsule by mouth daily.  Marland Kitchen aspirin EC 81 MG EC tablet Take 1 tablet (81 mg total) by mouth daily.  Marland Kitchen atorvastatin (LIPITOR) 80 MG tablet Take 80 mg by mouth daily.   . clopidogrel (PLAVIX) 75 MG tablet Take 75 mg by mouth daily.  Marland Kitchen ezetimibe (ZETIA) 10 MG tablet Take 1 tablet (10 mg total) by mouth daily.  . hydrochlorothiazide (HYDRODIURIL) 25 MG tablet Take 12.5 mg by mouth daily.   . Insulin Glargine-Lixisenatide 100-33 UNT-MCG/ML SOPN Inject 40 Units into the skin daily.  . magnesium oxide (MAG-OX) 400 MG tablet Take 400 mg by mouth daily.  . metoprolol succinate (TOPROL-XL) 25 MG 24 hr tablet TAKE 1 TABLET(25 MG) BY MOUTH DAILY  . nitroGLYCERIN (NITROSTAT) 0.4 MG SL tablet Place 1 tablet (0.4 mg total) under the tongue every 5 (five) minutes x 3 doses as needed for chest pain.  . pantoprazole (PROTONIX) 40 MG tablet Take 1 tablet  by mouth daily.  . Potassium Chloride ER 20 MEQ TBCR Take 20 mEq by mouth daily.     Allergies:   Patient has no known allergies.   Social History   Socioeconomic History  . Marital status: Married    Spouse name: Not on file  . Number of children: Not on file  . Years of education: Not on file  . Highest education level: Not on file  Occupational History  . 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 Smoking status: Former Smoker    Packs/day: 1.00    Years: 39.00    Pack years: 39.00    Types: Cigarettes    Quit date: 04/17/2014    Years since quitting: 6.2  . Smokeless tobacco: Never Used  . Tobacco comment: every once in a  while  Substance and Sexual Activity  . Alcohol use: Yes    Alcohol/week: 14.0 standard drinks    Types: 12 Cans of beer, 2 Shots of liquor per week    Comment: 02/17/2013 "6 beers/weekend day"  . Drug use: No  . Sexual activity: Yes  Other Topics Concern  . Not on file  Social History Narrative  . Not on file   Social Determinants of Health   Financial Resource Strain: Not on file  Food Insecurity: Not on file  Transportation Needs: Not on file  Physical Activity: Not on file  Stress: Not on file  Social Connections: Not on file     Family History: The patient's family history includes Hypertension in his father.  ROS:   Please see the history of present illness.     All other systems reviewed and are negative.  EKGs/Labs/Other Studies Reviewed:    The following studies were reviewed today:  Myoview June 2018- Negative Myoview report noted in DC summery from June 2018 admission but results are not in Epic.   EKG:  EKG is ordered today.  The ekg ordered today demonstrates NSR- HR 86  Recent Labs: No results found for requested labs within last 8760 hours.  Recent Lipid Panel    Component Value Date/Time   CHOL 154 09/23/2019 0953   TRIG 54 09/23/2019 0953   HDL 51 09/23/2019 0953   CHOLHDL 3.0 09/23/2019 0953   CHOLHDL 3.2 12/05/2016 1230   VLDL 9 12/05/2016 1230   LDLCALC 92 09/23/2019 0953    Physical Exam:    VS:  BP 136/60   Pulse 86   Ht 5\' 6"  (1.676 m)   Wt 180 lb (81.6 kg)   BMI 29.05 kg/m     Wt Readings from Last 3 Encounters:  07/05/20 180 lb (81.6 kg)  09/23/19 175 lb (79.4 kg)  05/04/18 175 lb (79.4 kg)     GEN:  Well nourished, well developed in no acute distress HEENT: Normal NECK: No JVD; LCA bruit CARDIAC: RRR, no murmurs, rubs, gallops RESPIRATORY:  Clear to auscultation without rales, wheezing or rhonchi  ABDOMEN: Soft,  non-distended MUSCULOSKELETAL:  No edema; No deformity  SKIN: Warm and dry NEUROLOGIC:  Alert and  oriented x 3 PSYCHIATRIC:  Normal affect   ASSESSMENT:    Palpitations One episode of breif tachycardia noted in Nov- none since  CAD S/P percutaneous coronary angioplasty CFX AV and OM2 PCI with DES 2008 Restudy 2014 in setting of STEMI-  No PCI done- (records are not clear and I could not find cath report) Myoview low risk June 2018  PAD (peripheral artery disease) (HCC) RSFA and LCIA PTA Aug 2018 Moderate RICA stenosis, mild LICA stenosis July 2021  HTN (hypertension) B/P by me 136/60- he has not taken his medications yet today  Dyslipidemia, goal LDL below 70 On high dose statin Rx- PCP follows  PLAN:    Advised to avoid caffeine and OTC decongestants. If he has recurrent episodes he will need a monitor placed.  There is room to increase his beta blocker if needed.  F/U Dr Allyson Sabal in 6 months.    Medication Adjustments/Labs and Tests Ordered: Current medicines are reviewed at length with the patient today.  Concerns regarding medicines are outlined above.  Orders Placed This Encounter  Procedures  . EKG 12-Lead   No orders of the defined types were placed in this encounter.   Patient Instructions  Medication Instructions:  Continue current medications  *If you need a refill on your cardiac medications before your next appointment, please call your pharmacy*   Lab Work: None Ordered   Testing/Procedures: None ordered   Follow-Up: At [facility], you and your health needs are our priority.  As part of our continuing mission to provide you with exceptional heart care, we have created designated Provider Care Teams.  These Care Teams include your primary Cardiologist (physician) and Advanced Practice Providers (APPs -  Physician Assistants and Nurse Practitioners) who all work together to provide you with the care you need, when you need it.  We recommend signing up for the patient portal called "MyChart".  Sign up information is provided on this After Visit  Summary.  MyChart is used to connect with patients for Virtual Visits (Telemedicine).  Patients are able to view lab/test results, encounter notes, upcoming appointments, etc.  Non-urgent messages can be sent to your provider as well.   To learn more about what you can do with MyChart, go to [email or web address].    Your next appointment:   6 month(s)  The format for your next appointment:   In Person  Provider:   You may see Nanetta Batty, MD or one of the following Advanced Practice Providers on your designated Care Team:    Corine Shelter, PA-C  [place], [place]  Edd Fabian, FNP        Signed, Corine Shelter, [place]  07/05/2020 8:29 AM    [facility]

## 2020-07-05 NOTE — Assessment & Plan Note (Signed)
CFX AV and OM2 PCI with DES 2008 Restudy 2014 in setting of STEMI-  No PCI done- (records are not clear and I could not find cath report) Myoview low risk June 2018

## 2020-07-30 ENCOUNTER — Emergency Department (HOSPITAL_COMMUNITY): Payer: Medicare PPO

## 2020-07-30 ENCOUNTER — Emergency Department (HOSPITAL_COMMUNITY)
Admission: EM | Admit: 2020-07-30 | Discharge: 2020-07-31 | Disposition: A | Discharge status: Home / Self Care | Payer: Medicare PPO | Attending: Emergency Medicine | Admitting: Emergency Medicine

## 2020-07-30 ENCOUNTER — Other Ambulatory Visit: Payer: Self-pay

## 2020-07-30 DIAGNOSIS — Z5321 Procedure and treatment not carried out due to patient leaving prior to being seen by health care provider: Secondary | ICD-10-CM | POA: Diagnosis not present

## 2020-07-30 DIAGNOSIS — K219 Gastro-esophageal reflux disease without esophagitis: Secondary | ICD-10-CM | POA: Insufficient documentation

## 2020-07-30 LAB — CBC
HCT: 36.9 % — ABNORMAL LOW (ref 39.0–52.0)
Hemoglobin: 12.9 g/dL — ABNORMAL LOW (ref 13.0–17.0)
MCH: 31.7 pg (ref 26.0–34.0)
MCHC: 35 g/dL (ref 30.0–36.0)
MCV: 90.7 fL (ref 80.0–100.0)
Platelets: 241 10*3/uL (ref 150–400)
RBC: 4.07 MIL/uL — ABNORMAL LOW (ref 4.22–5.81)
RDW: 13.1 % (ref 11.5–15.5)
WBC: 5.8 10*3/uL (ref 4.0–10.5)
nRBC: 0 % (ref 0.0–0.2)

## 2020-07-30 LAB — BASIC METABOLIC PANEL
Anion gap: 14 (ref 5–15)
BUN: 18 mg/dL (ref 8–23)
CO2: 23 mmol/L (ref 22–32)
Calcium: 9.1 mg/dL (ref 8.9–10.3)
Chloride: 100 mmol/L (ref 98–111)
Creatinine, Ser: 0.99 mg/dL (ref 0.61–1.24)
GFR, Estimated: >60 mL/min (ref >60)
Glucose, Bld: 235 mg/dL — ABNORMAL HIGH (ref 70–99)
Potassium: 3 mmol/L — ABNORMAL LOW (ref 3.5–5.1)
Sodium: 137 mmol/L (ref 135–145)

## 2020-07-30 LAB — TROPONIN I (HIGH SENSITIVITY)
Troponin I (High Sensitivity): 15 ng/L (ref <18)
Troponin I (High Sensitivity): 14 ng/L (ref <18)

## 2020-07-30 NOTE — ED Triage Notes (Signed)
Pt c/o indigestion x 2-3 days. Admits to drinking liquor of the last 2-3 days. Hx of cardiac stents.

## 2020-07-31 NOTE — ED Notes (Signed)
Patient upset about family having to wait outside and states he is leaving

## 2020-10-24 ENCOUNTER — Other Ambulatory Visit: Payer: Self-pay | Admitting: Cardiovascular Disease

## 2020-11-27 ENCOUNTER — Ambulatory Visit: Payer: Medicare PPO | Admitting: Dietician

## 2020-12-12 ENCOUNTER — Ambulatory Visit: Payer: Medicare PPO | Admitting: Cardiovascular Disease

## 2021-01-09 ENCOUNTER — Ambulatory Visit: Payer: Medicare PPO | Admitting: Registered"

## 2021-01-21 ENCOUNTER — Telehealth: Payer: Self-pay | Admitting: Cardiovascular Disease

## 2021-01-21 NOTE — Telephone Encounter (Signed)
Pt wants to know why he have an appt coming up on 01/23/21. Please advise pt further

## 2021-01-21 NOTE — Telephone Encounter (Signed)
Returned call to patient, made patient aware that appointment for 7/20 with Dr. Allyson Sabal is his 6 Month follow up appointment. Patient requesting to have appointment on 7/29, offered patient an afternoon slot that day but patient unable to make that appointment. Patient would like to keep 7/20 appointment as scheduled.   Advised patient to call back to office with any issues, questions, or concerns. Patient verbalized understanding.

## 2021-01-23 ENCOUNTER — Ambulatory Visit: Payer: Medicare PPO | Admitting: Cardiovascular Disease

## 2021-02-01 ENCOUNTER — Ambulatory Visit (HOSPITAL_BASED_OUTPATIENT_CLINIC_OR_DEPARTMENT_OTHER)
Admission: RE | Admit: 2021-02-01 | Discharge: 2021-02-01 | Disposition: A | Discharge status: Home / Self Care | Payer: Medicare PPO | Source: Ambulatory Visit | Attending: Cardiovascular Disease | Admitting: Cardiovascular Disease

## 2021-02-01 ENCOUNTER — Ambulatory Visit (HOSPITAL_COMMUNITY)
Admission: RE | Admit: 2021-02-01 | Discharge: 2021-02-01 | Disposition: A | Discharge status: Home / Self Care | Payer: Medicare PPO | Source: Ambulatory Visit | Attending: Cardiovascular Disease | Admitting: Cardiovascular Disease

## 2021-02-01 ENCOUNTER — Other Ambulatory Visit: Payer: Self-pay

## 2021-02-01 DIAGNOSIS — I739 Peripheral vascular disease, unspecified: Secondary | ICD-10-CM | POA: Insufficient documentation

## 2021-02-01 DIAGNOSIS — Z9862 Peripheral vascular angioplasty status: Secondary | ICD-10-CM

## 2021-02-01 DIAGNOSIS — I6523 Occlusion and stenosis of bilateral carotid arteries: Secondary | ICD-10-CM | POA: Insufficient documentation

## 2021-02-01 DIAGNOSIS — Z95828 Presence of other vascular implants and grafts: Secondary | ICD-10-CM | POA: Insufficient documentation

## 2021-02-05 ENCOUNTER — Encounter: Payer: Self-pay | Admitting: *Deleted

## 2021-02-26 ENCOUNTER — Encounter: Payer: Self-pay | Admitting: Cardiovascular Disease

## 2021-02-26 ENCOUNTER — Ambulatory Visit (INDEPENDENT_AMBULATORY_CARE_PROVIDER_SITE_OTHER): Payer: Medicare PPO | Admitting: Cardiovascular Disease

## 2021-02-26 ENCOUNTER — Other Ambulatory Visit: Payer: Self-pay

## 2021-02-26 DIAGNOSIS — Z720 Tobacco use: Secondary | ICD-10-CM | POA: Diagnosis not present

## 2021-02-26 DIAGNOSIS — I2510 Atherosclerotic heart disease of native coronary artery without angina pectoris: Secondary | ICD-10-CM

## 2021-02-26 DIAGNOSIS — Z9861 Coronary angioplasty status: Secondary | ICD-10-CM

## 2021-02-26 DIAGNOSIS — E785 Hyperlipidemia, unspecified: Secondary | ICD-10-CM | POA: Diagnosis not present

## 2021-02-26 DIAGNOSIS — I10 Essential (primary) hypertension: Secondary | ICD-10-CM

## 2021-02-26 DIAGNOSIS — I6521 Occlusion and stenosis of right carotid artery: Secondary | ICD-10-CM

## 2021-02-26 DIAGNOSIS — I739 Peripheral vascular disease, unspecified: Secondary | ICD-10-CM

## 2021-02-26 NOTE — Assessment & Plan Note (Signed)
History of carotid artery disease with duplex performed 02/01/2021 revealing moderate right ICA stenosis.  This will be repeated on an annual basis.

## 2021-02-26 NOTE — Patient Instructions (Signed)
Medication Instructions:  The current medical regimen is effective;  continue present plan and medications.  *If you need a refill on your cardiac medications before your next appointment, please call your pharmacy*   Lab Work: LIPID/LIVER (come back fasting for this, nothing to eat or drink, no lab appointment needed)  If you have labs (blood work) drawn today and your tests are completely normal, you will receive your results only by: MyChart Message (if you have MyChart) OR A paper copy in the mail If you have any lab test that is abnormal or we need to change your treatment, we will call you to review the results.   Testing/Procedures: Your physician has requested that you have a carotid duplex. This test is an ultrasound of the carotid arteries in your neck. It looks at blood flow through these arteries that supply the brain with blood. Allow one hour for this exam. There are no restrictions or special instructions.   Your physician has requested that you have a lower or upper extremity arterial duplex. This test is an ultrasound of the arteries in the legs or arms. It looks at arterial blood flow in the legs and arms. Allow one hour for Lower and Upper Arterial scans. There are no restrictions or special instructions.   AORTA/IVC/ILIACS   Follow-Up: At [facility], you and your health needs are our priority.  As part of our continuing mission to provide you with exceptional heart care, we have created designated Provider Care Teams.  These Care Teams include your primary Cardiologist (physician) and Advanced Practice Providers (APPs -  Physician Assistants and Nurse Practitioners) who all work together to provide you with the care you need, when you need it.  We recommend signing up for the patient portal called "MyChart".  Sign up information is provided on this After Visit Summary.  MyChart is used to connect with patients for Virtual Visits (Telemedicine).  Patients are able to  view lab/test results, encounter notes, upcoming appointments, etc.  Non-urgent messages can be sent to your provider as well.   To learn more about what you can do with MyChart, go to [email or web address].    Your next appointment:   12 month(s)  The format for your next appointment:   In Person  Provider:   Nanetta Batty, MD

## 2021-02-26 NOTE — Assessment & Plan Note (Signed)
History of PAD status post orbital atherectomy, PTA and covered stenting of a highly calcified left common iliac artery stenosis along with directional atherectomy and drug-coated balloon angioplasty of diffuse segmental high-grade right SFA stenosis.  He had one-vessel runoff on the right, 2 in the left with an occluded left SFA.  After that his claudication improved.  He now has minimal claudication but has had significant restenosis of his right SFA.  We will continue to follow him by duplex ultrasound.

## 2021-02-26 NOTE — Assessment & Plan Note (Signed)
History of CAD status post cardiac catheterization by myself in 2008 after stress test was positive demonstrated an occluded PDA with collaterals and high-grade OM 2 disease and proximal segmental AV groove circumflex disease.  He underwent PCI and drug-eluting stenting of the left circumflex, OM 2 successfully.  He had catheterization again in 2014 which demonstrated patent LAD stent and diffuse RCA disease.  Medical therapy was recommended.  He denies chest pain or shortness of breath.

## 2021-02-26 NOTE — Assessment & Plan Note (Signed)
History of essential hypertension a blood pressure measured today at 148/60.  He is on amlodipine, benazepril, hydrochlorothiazide and metoprolol.

## 2021-02-26 NOTE — Progress Notes (Signed)
02/26/2021 Austin Mclaughlin   1945/10/24  [id number]  Primary Physician Ralene Ok, MD Primary Cardiologist: [name] MD FACP, [place], [place], [place]  HPI:  Austin Mclaughlin is a 75 y.o.  married, father of 4 who I last saw in the office   09/23/2019. He has a history of CAD, s/p cath in 2008 after a stress test for CP showed ischemia. The cath, at that time, demonstrated an occluded PDA and collaterals with high grade OM2 disease and proximal segmental AV groove left circ disease. He underwent PCI of the AV groove left circ and OM2 with DES. He had reportedly done well until developing chest pain the day prior to arrival to the ED. The CP had been intermittent for over the course of 24 hours, before recurring and prompting the patient to seek evaluation. He described the pain as a sharp, stinging pain in his left chest with no SOB, nausea or diaphoresis. At the [facility] ER he was noted to have 18mm of ST elevation in V1 and V2 and a STEMI was called and he was transferred to [facility] On arrival to [facility], his chest pain resolved. He was taken urgently to the cath lab. The procedure was performed by Dr. Allyson Sabal via the right femoral artery. It demonstrated a patent LAD stent and diffuse RCA disease. Medical therapy was recommended. He left the cath lab in stable condition. Post-cath, he was noted to have moderate hypertension. He was placed on Lopressor, HCTZ and lisinopril. He had mild improvement in BP. The patient saw H[facility]stock [facility] back in the office the Monday after discharge and his lisinopril was up titrated. Since that time he's had no chest pain and for some reason no longer is on an ACE inhibitor. He also has stopped smoking several months ago after smoking one pack per day for 55 years.    He was evaluated in the emergency room at Dr. Elease Hashimoto for chest pain. His enzymes were negative and a Myoview stress test was low risk. He has had no recurrent chest pain since that time. He does complain of some calf  claudication bilaterally which is lifestyle limiting. He did have segmental pressures performed at [facility] 12/02/16 revealing a right ABI 0.54 and a left of 0.74. These did drop to .15 and .44 respectively with exercise. Based on this, I decided to proceed with duplex ultrasound and potential endovascular therapy. This was performed 01/26/17 revealing a right ABI 0.53 with a high-frequency signal of the origin of his right SFA and a left ABI 0.75 with an occluded mid left SFA.    I performed angiography and intervention on him 02/26/17. I performed a diamond echo orbital facial atherectomy followed by V BX cover stenting of a calcified high-grade left common iliac artery stenosis in addition to [facility] one directional atherectomy and drug-eluting glandular plasty of his entire right SFA. His claudication resolved and his Dopplers improved.   Is recently seen in the emergency room with chest pain last week after lifting heavy objects at work. His pain has not recurred. He no longer has claudication either.   Since I saw him in the office over 1 year ago he continues to deny chest pain, shortness of breath or claudication.  His Doppler studies performed in July 2022 did suggest that his left iliac stent was patent.  He has had restenosis of his right SFA.  He has moderate right internal carotid artery stenosis.  He denies chest pain or shortness  of breath.  He gets occasional but not lifestyle limiting claudication.     Current Meds  Medication Sig   amLODipine-benazepril (LOTREL) 10-40 MG capsule Take 1 capsule by mouth daily.   aspirin EC 81 MG EC tablet Take 1 tablet (81 mg total) by mouth daily.   atorvastatin (LIPITOR) 80 MG tablet Take 80 mg by mouth daily.    clopidogrel (PLAVIX) 75 MG tablet Take 75 mg by mouth daily.   ezetimibe (ZETIA) 10 MG tablet TAKE 1 TABLET(10 MG) BY MOUTH DAILY   hydrochlorothiazide (HYDRODIURIL) 25 MG tablet Take 12.5 mg by mouth daily.    Insulin  Glargine-Lixisenatide 100-33 UNT-MCG/ML SOPN Inject 40 Units into the skin daily.   magnesium oxide (MAG-OX) 400 MG tablet Take 400 mg by mouth daily.   metoprolol succinate (TOPROL-XL) 25 MG 24 hr tablet TAKE 1 TABLET(25 MG) BY MOUTH DAILY   nitroGLYCERIN (NITROSTAT) 0.4 MG SL tablet Place 1 tablet (0.4 mg total) under the tongue every 5 (five) minutes x 3 doses as needed for chest pain.   pantoprazole (PROTONIX) 40 MG tablet Take 1 tablet by mouth daily.   Potassium Chloride ER 20 MEQ TBCR Take 20 mEq by mouth daily.     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  Tobacco Use   Smoking status: Former    Packs/day: 1.00    Years: 39.00    Pack years: 39.00    Types: Cigarettes    Quit date: 04/17/2014    Years since quitting: 6.8   Smokeless tobacco: Never   Tobacco comments:    every once in a while  Substance and Sexual Activity   Alcohol use: Yes    Alcohol/week: 14.0 standard drinks    Types: 12 Cans of beer, 2 Shots of liquor per week    Comment: 02/17/2013 "6 beers/weekend day"   Drug use: No   Sexual activity: Yes  Other Topics Concern   Not on file  Social History Narrative   Not on file   Social Determinants of Health   Financial Resource Strain: Not on file  Food Insecurity: Not on file  Transportation Needs: Not on file  Physical Activity: Not on file  Stress: Not on file  Social Connections: Not on file  Intimate Partner Violence: Not on file     Review of Systems: General: negative for chills, fever, night sweats or weight changes.  Cardiovascular: negative for chest pain, dyspnea on exertion, edema, orthopnea, palpitations, paroxysmal nocturnal dyspnea or  shortness of breath Dermatological: negative for rash Respiratory: negative for cough or wheezing Urologic: negative for hematuria Abdominal: negative for nausea, vomiting, diarrhea, bright red blood per rectum, melena, or hematemesis Neurologic: negative for visual changes, syncope, or dizziness All other systems reviewed and are otherwise negative except as noted above.    Blood pressure (!) 148/60, pulse 88, height 5\' 6"  (1.676 m), weight 181 lb (82.1 kg).  General appearance: alert and no distress Neck: no adenopathy, no JVD, supple, symmetrical, trachea midline, thyroid not enlarged, symmetric, no tenderness/mass/nodules, and bilateral carotid bruits Lungs: clear to auscultation bilaterally Heart: regular rate and rhythm, S1, S2 normal, no murmur, click, rub  or gallop Extremities: extremities normal, atraumatic, no cyanosis or edema Pulses: Diminished pedal pulses bilaterally Skin: Skin color, texture, turgor normal. No rashes or lesions Neurologic: Grossly normal  EKG sinus rhythm with occasional PVCs.  Personally reviewed this EKG.  ASSESSMENT AND PLAN:   HTN (hypertension) History of essential hypertension a blood pressure measured today at 148/60.  He is on amlodipine, benazepril, hydrochlorothiazide and metoprolol.  CAD S/P percutaneous coronary angioplasty History of CAD status post cardiac catheterization by myself in 2008 after stress test was positive demonstrated an occluded PDA with collaterals and high-grade OM 2 disease and proximal segmental AV groove circumflex disease.  He underwent PCI and drug-eluting stenting of the left circumflex, OM 2 successfully.  He had catheterization again in 2014 which demonstrated patent LAD stent and diffuse RCA disease.  Medical therapy was recommended.  He denies chest pain or shortness of breath.  Tobacco abuse History of discontinue tobacco abuse  Dyslipidemia, goal LDL below 70 History of dyslipidemia on statin therapy and  Zinamide with lipid profile performed 09/23/2019 revealing a total cholesterol of 154, LDL of 92 and HDL 51.  We will recheck a fasting lipid liver profile.  PAD (peripheral artery disease) (HCC) History of PAD status post orbital atherectomy, PTA and covered stenting of a highly calcified left common iliac artery stenosis along with directional atherectomy and drug-coated balloon angioplasty of diffuse segmental high-grade right SFA stenosis.  He had one-vessel runoff on the right, 2 in the left with an occluded left SFA.  After that his claudication improved.  He now has minimal claudication but has had significant restenosis of his right SFA.  We will continue to follow him by duplex ultrasound.  Carotid artery disease (HCC) History of carotid artery disease with duplex performed 02/01/2021 revealing moderate right ICA stenosis.  This will be repeated on an annual basis.     [name] MD FACP,FACC,FAHA, [facility] 02/26/2021 10:52 AM

## 2021-02-26 NOTE — Assessment & Plan Note (Signed)
History of discontinue tobacco abuse. 

## 2021-02-26 NOTE — Assessment & Plan Note (Signed)
History of dyslipidemia on statin therapy and Zinamide with lipid profile performed 09/23/2019 revealing a total cholesterol of 154, LDL of 92 and HDL 51.  We will recheck a fasting lipid liver profile.

## 2021-07-01 ENCOUNTER — Ambulatory Visit (INDEPENDENT_AMBULATORY_CARE_PROVIDER_SITE_OTHER): Payer: Medicare PPO

## 2021-07-01 ENCOUNTER — Encounter (HOSPITAL_COMMUNITY): Payer: Self-pay

## 2021-07-01 ENCOUNTER — Other Ambulatory Visit: Payer: Self-pay

## 2021-07-01 ENCOUNTER — Ambulatory Visit (HOSPITAL_COMMUNITY)
Admission: EM | Admit: 2021-07-01 | Discharge: 2021-07-01 | Disposition: A | Discharge status: Home / Self Care | Payer: Medicare PPO | Attending: Family Medicine | Admitting: Family Medicine

## 2021-07-01 DIAGNOSIS — M542 Cervicalgia: Secondary | ICD-10-CM

## 2021-07-01 MED ORDER — KETOROLAC TROMETHAMINE 30 MG/ML IJ SOLN
30.0000 mg | Freq: Once | INTRAMUSCULAR | Status: AC
  Administered 2021-07-01: 16:00:00 30 mg via INTRAMUSCULAR

## 2021-07-01 MED ORDER — KETOROLAC TROMETHAMINE 30 MG/ML IJ SOLN
INTRAMUSCULAR | Status: AC
  Filled 2021-07-01: qty 1

## 2021-07-01 NOTE — Discharge Instructions (Addendum)
Try a heating pad to the sore area.  Tylenol 500 mg 2 tabs every 6 hours as needed for pain for the next few days.  You have been given a shot of toradol 30 mg today for pain.  See your regular doctor in the next 1-2 weeks to recheck, especially if you are not improving much.

## 2021-07-01 NOTE — ED Provider Notes (Signed)
[facility]    CSN: [id number] Arrival date & time: 07/01/21  1141      History   Chief Complaint Chief Complaint  Patient presents with   Neck Pain    HPI Austin Mclaughlin is a 75 y.o. male.    Neck Pain Here for a 10 day h/o left neck pain, hurting more when trying to turn his head or when abducting his left arm past 90 degrees. No trauma or fall. No fever/chills/URI symptoms.  PMH: DM, HTN. Sugars lately are running 120s. BP today is 177/93.  Past Medical History:  Diagnosis Date   Arthritis    "all over" (02/17/2013)   Chronic lower back pain    Coronary artery disease    GERD (gastroesophageal reflux disease)    High cholesterol    Hypertension    Myocardial infarction (HCC) 2008   stents to AV CFX & OM   Type II diabetes mellitus [facility])     Patient Active Problem List   Diagnosis Date Noted   Palpitations 07/05/2020   Carotid artery disease (HCC) 09/23/2019   Dizziness 10/23/2017   Pulmonary nodule 10/23/2017   Claudication in peripheral vascular disease (HCC) 02/26/2017   Chest pain, rule out acute myocardial infarction 12/05/2016   PAD (peripheral artery disease) (HCC) 12/05/2016   Hypokalemia    Dyslipidemia, goal LDL below 70 04/26/2014   CAD S/P percutaneous coronary angioplasty 02/16/2013   Chest pain 02/16/2013   Tobacco abuse 02/16/2013   HTN (hypertension) 02/16/2013   DM type 2 (diabetes mellitus, type 2) (HCC) 02/16/2013    Past Surgical History:  Procedure Laterality Date   CARDIAC CATHETERIZATION  02/15/2013   CORONARY ANGIOPLASTY WITH STENT PLACEMENT  2008   DES AV CFX & OM2   LOWER EXTREMITY ANGIOGRAPHY Bilateral 02/26/2017   Procedure: Lower Extremity Angiography;  Surgeon: [name], MD;  Location: [facility] INVASIVE CV LAB;  Service: Cardiovascular;  Laterality: Bilateral;   NM MYOVIEW LTD  2016   Normal   PERIPHERAL VASCULAR ATHERECTOMY Right 02/26/2017   Procedure: PERIPHERAL VASCULAR ATHERECTOMY;  Surgeon: [name], MD;  Location: [facility] INVASIVE CV LAB;  Service: Cardiovascular;  Laterality: Right;  SFA left common iliac athrectomy    PERIPHERAL VASCULAR INTERVENTION Left 02/26/2017   Procedure: PERIPHERAL VASCULAR INTERVENTION;  Surgeon: [name], MD;  Location: [facility] INVASIVE CV LAB;  Service: Cardiovascular;  Laterality: Left;  left common iliac       Home Medications    Prior to Admission medications   Medication Sig Start Date End Date Taking? Authorizing Provider  amLODipine-benazepril (LOTREL) 10-40 MG capsule Take 1 capsule by mouth daily. 09/24/16   [provider]  aspirin EC 81 MG EC tablet Take 1 tablet (81 mg total) by mouth daily. 02/17/13   Robbie Lis M, PA-C  atorvastatin (LIPITOR) 80 MG tablet Take 80 mg by mouth daily.  12/31/16   [provider]  clopidogrel (PLAVIX) 75 MG tablet Take 75 mg by mouth daily.    [provider]  ezetimibe (ZETIA) 10 MG tablet TAKE 1 TABLET(10 MG) BY MOUTH DAILY 10/24/20   [name], MD  hydrochlorothiazide (HYDRODIURIL) 25 MG tablet Take 12.5 mg by mouth daily.  11/05/15   [provider]  Insulin Glargine-Lixisenatide 100-33 UNT-MCG/ML SOPN Inject 40 Units into the skin daily.    [provider]  magnesium oxide (MAG-OX) 400 MG tablet Take 400 mg by mouth daily.    [provider]  metoprolol succinate (  TOPROL-XL) 25 MG 24 hr tablet TAKE 1 TABLET(25 MG) BY MOUTH DAILY 01/15/18   [name], MD  nitroGLYCERIN (NITROSTAT) 0.4 MG SL tablet Place 1 tablet (0.4 mg total) under the tongue every 5 (five) minutes x 3 doses as needed for chest pain. 02/17/13   Robbie Lis M, PA-C  pantoprazole (PROTONIX) 40 MG tablet Take 1 tablet by mouth daily. 09/18/17   [provider]  Potassium Chloride ER 20 MEQ TBCR Take 20 mEq by mouth daily. 01/23/17   [provider]    Family History Family History  Problem Relation Age of Onset   Hypertension Father     Social  History Social History   Tobacco Use   Smoking status: Former    Packs/day: 1.00    Years: 39.00    Pack years: 39.00    Types: Cigarettes    Quit date: 04/17/2014    Years since quitting: 7.2   Smokeless tobacco: Never   Tobacco comments:    every once in a while  Substance Use Topics   Alcohol use: Yes    Alcohol/week: 14.0 standard drinks    Types: 12 Cans of beer, 2 Shots of liquor per week    Comment: 02/17/2013 "6 beers/weekend day"   Drug use: No     Allergies   Patient has no known allergies.   Review of Systems Review of Systems  Musculoskeletal:  Positive for neck pain.    Physical Exam Triage Vital Signs ED Triage Vitals  Enc Vitals Group     BP 07/01/21 1417 (!) 177/93     Pulse Rate 07/01/21 1417 (!) 104     Resp 07/01/21 1417 20     Temp 07/01/21 1417 99.3 F (37.4 C)     Temp Source 07/01/21 1417 Oral     SpO2 07/01/21 1417 95 %     Weight --      Height --      Head Circumference --      Peak Flow --      Pain Score 07/01/21 1418 10     Pain Loc --      Pain Edu? --      Excl. in GC? --    No data found.  Updated Vital Signs BP (!) 177/93 (BP Location: Left Arm)    Pulse (!) 104    Temp 99.3 F (37.4 C) (Oral)    Resp 20    SpO2 95%   Visual Acuity Right Eye Distance:   Left Eye Distance:   Bilateral Distance:    Right Eye Near:   Left Eye Near:    Bilateral Near:     Physical Exam   UC Treatments / Results  Labs (all labs ordered are listed, but only abnormal results are displayed) Labs Reviewed - No data to display  EKG   Radiology DG Cervical Spine Complete  Result Date: 07/01/2021 CLINICAL DATA:  Neck pain EXAM: CERVICAL SPINE - COMPLETE 4+ VIEW COMPARISON:  02/24/2014 FINDINGS: No fracture or static subluxation of the cervical spine. Minimal degenerative anterolisthesis of C3 on C4 and C4 on C5, unchanged compared to prior examination. Minimal endplate osteophytosis throughout with preserved disc spaces. There may  be bony neural foraminal stenosis bilaterally at C3 through C6. The skull base, cervical soft tissues, and upper chest are unremarkable. IMPRESSION: 1.  No fracture or static subluxation of the cervical spine. 2. Minimal degenerative anterolisthesis of C3 on C4 and C4 on C5, unchanged compared  to prior examination. Minimal endplate osteophytosis throughout. There may be bony neural foraminal stenosis bilaterally at C3 through C6. Cervical disc and neural foraminal pathology may be further evaluated by MRI if indicated by neurologically localizing signs and symptoms. Electronically Signed   By: Jearld Lesch M.D.   On: 07/01/2021 15:30    Procedures Procedures (including critical care time)  Medications Ordered in UC Medications  ketorolac (TORADOL) 30 MG/ML injection 30 mg (has no administration in time range)    Initial Impression / Assessment and Plan / UC Course  I have reviewed the triage vital signs and the nursing notes.  Pertinent labs & imaging results that were available during my care of the patient were reviewed by me and considered in my medical decision making (see chart for details).     C spine xrays show some mild arthritis changes, and possible neuroforaminal narrowing.  Final Clinical Impressions(s) / UC Diagnoses   Final diagnoses:  Neck pain     Discharge Instructions      Try a heating pad to the sore area.  Tylenol 500 mg 2 tabs every 6 hours as needed for pain for the next few days.  You have been given a shot of toradol 30 mg today for pain.  See your regular doctor in the next 1-2 weeks to recheck, especially if you are not improving much.     ED Prescriptions   None    PDMP not reviewed this encounter.   Zenia Resides, MD 07/01/21 1539

## 2021-07-01 NOTE — ED Triage Notes (Signed)
Pt states received his flu shot a week and a half ago to rt arm. Pt c/o pain to the site radiating up to rt shoulder and rt neck. States now having lt neck pain. States pain on movement and when trying to sleep. Denies injury.

## 2021-11-11 ENCOUNTER — Other Ambulatory Visit: Payer: Self-pay | Admitting: Cardiovascular Disease

## 2022-02-13 ENCOUNTER — Other Ambulatory Visit: Payer: Self-pay | Admitting: Cardiovascular Disease

## 2022-02-13 DIAGNOSIS — I739 Peripheral vascular disease, unspecified: Secondary | ICD-10-CM

## 2022-02-21 ENCOUNTER — Ambulatory Visit (HOSPITAL_COMMUNITY)
Admission: RE | Admit: 2022-02-21 | Discharge: 2022-02-21 | Disposition: A | Discharge status: Home / Self Care | Payer: Medicare PPO | Source: Ambulatory Visit | Attending: Cardiovascular Disease | Admitting: Cardiovascular Disease

## 2022-02-21 ENCOUNTER — Ambulatory Visit (HOSPITAL_BASED_OUTPATIENT_CLINIC_OR_DEPARTMENT_OTHER)
Admission: RE | Admit: 2022-02-21 | Discharge: 2022-02-21 | Disposition: A | Discharge status: Home / Self Care | Payer: Medicare PPO | Source: Ambulatory Visit | Attending: Cardiovascular Disease | Admitting: Cardiovascular Disease

## 2022-02-21 DIAGNOSIS — I6521 Occlusion and stenosis of right carotid artery: Secondary | ICD-10-CM

## 2022-02-21 DIAGNOSIS — I739 Peripheral vascular disease, unspecified: Secondary | ICD-10-CM

## 2022-02-21 DIAGNOSIS — Z95828 Presence of other vascular implants and grafts: Secondary | ICD-10-CM

## 2022-06-02 ENCOUNTER — Other Ambulatory Visit (HOSPITAL_COMMUNITY): Payer: Self-pay | Admitting: Cardiovascular Disease

## 2022-06-02 DIAGNOSIS — I771 Stricture of artery: Secondary | ICD-10-CM

## 2022-06-02 DIAGNOSIS — I6523 Occlusion and stenosis of bilateral carotid arteries: Secondary | ICD-10-CM

## 2022-12-09 ENCOUNTER — Other Ambulatory Visit: Payer: Self-pay | Admitting: Cardiovascular Disease

## 2023-01-09 ENCOUNTER — Other Ambulatory Visit: Payer: Self-pay | Admitting: Cardiovascular Disease

## 2023-02-16 ENCOUNTER — Other Ambulatory Visit: Payer: Self-pay | Admitting: Cardiovascular Disease

## 2023-03-06 ENCOUNTER — Ambulatory Visit (HOSPITAL_COMMUNITY): Payer: Medicare PPO

## 2023-03-06 ENCOUNTER — Ambulatory Visit (HOSPITAL_COMMUNITY): Admission: RE | Admit: 2023-03-06 | Payer: Medicare PPO | Source: Ambulatory Visit

## 2023-03-26 ENCOUNTER — Ambulatory Visit: Payer: Medicare PPO | Admitting: Dietician

## 2023-03-26 ENCOUNTER — Other Ambulatory Visit: Payer: Self-pay | Admitting: Cardiovascular Disease

## 2023-03-31 ENCOUNTER — Ambulatory Visit (HOSPITAL_COMMUNITY)
Admission: RE | Admit: 2023-03-31 | Discharge: 2023-03-31 | Disposition: A | Discharge status: Home / Self Care | Payer: Medicare PPO | Source: Ambulatory Visit | Attending: Cardiovascular Disease | Admitting: Cardiovascular Disease

## 2023-03-31 ENCOUNTER — Encounter (HOSPITAL_COMMUNITY): Payer: Medicare PPO

## 2023-03-31 ENCOUNTER — Ambulatory Visit (HOSPITAL_BASED_OUTPATIENT_CLINIC_OR_DEPARTMENT_OTHER)
Admission: RE | Admit: 2023-03-31 | Discharge: 2023-03-31 | Disposition: A | Discharge status: Home / Self Care | Payer: Medicare PPO | Source: Ambulatory Visit | Attending: Cardiovascular Disease | Admitting: Cardiovascular Disease

## 2023-03-31 ENCOUNTER — Other Ambulatory Visit (HOSPITAL_COMMUNITY): Payer: Self-pay | Admitting: Cardiovascular Disease

## 2023-03-31 DIAGNOSIS — I739 Peripheral vascular disease, unspecified: Secondary | ICD-10-CM | POA: Diagnosis not present

## 2023-03-31 DIAGNOSIS — Z95820 Peripheral vascular angioplasty status with implants and grafts: Secondary | ICD-10-CM | POA: Diagnosis present

## 2023-03-31 DIAGNOSIS — I6523 Occlusion and stenosis of bilateral carotid arteries: Secondary | ICD-10-CM

## 2023-03-31 DIAGNOSIS — I771 Stricture of artery: Secondary | ICD-10-CM

## 2023-04-01 LAB — VAS US ABI WITH/WO TBI
Left ABI: 0.56
Right ABI: 0.41

## 2023-04-07 ENCOUNTER — Telehealth: Payer: Self-pay | Admitting: Cardiovascular Disease

## 2023-04-07 NOTE — Telephone Encounter (Signed)
Patient returned RN's call regarding results. 

## 2023-04-07 NOTE — Telephone Encounter (Signed)
Called pt back regarding results of recent dopplers. Per Dr. Allyson Sabal, LEA dopplers and ABIs have declined and would like to see pt back for office visit to discuss. Pt has some travel plans coming up this month and would like to call us back when he returns. Pt will call back to set up office visit to see Dr. Allyson Sabal. Pt verbalizes understanding.

## 2023-09-08 ENCOUNTER — Ambulatory Visit: Payer: Medicare PPO | Admitting: Endocrinology

## 2023-09-29 ENCOUNTER — Encounter: Payer: Self-pay | Admitting: Cardiovascular Disease

## 2023-09-29 ENCOUNTER — Ambulatory Visit: Attending: Cardiovascular Disease | Admitting: Cardiovascular Disease

## 2023-09-29 VITALS — BP 124/58 | HR 76 | Ht 63.0 in | Wt 176.6 lb

## 2023-09-29 DIAGNOSIS — Z720 Tobacco use: Secondary | ICD-10-CM | POA: Diagnosis not present

## 2023-09-29 DIAGNOSIS — I739 Peripheral vascular disease, unspecified: Secondary | ICD-10-CM

## 2023-09-29 DIAGNOSIS — E785 Hyperlipidemia, unspecified: Secondary | ICD-10-CM | POA: Diagnosis not present

## 2023-09-29 DIAGNOSIS — I2510 Atherosclerotic heart disease of native coronary artery without angina pectoris: Secondary | ICD-10-CM

## 2023-09-29 DIAGNOSIS — I10 Essential (primary) hypertension: Secondary | ICD-10-CM | POA: Diagnosis not present

## 2023-09-29 DIAGNOSIS — Z9861 Coronary angioplasty status: Secondary | ICD-10-CM

## 2023-09-29 DIAGNOSIS — I6521 Occlusion and stenosis of right carotid artery: Secondary | ICD-10-CM

## 2023-09-29 NOTE — Assessment & Plan Note (Addendum)
 History of CAD status post cardiac catheterization by myself in 2008 demonstrated an occluded PDA with collaterals and high-grade OM 2 disease in proximal AV groove circumflex disease.  He underwent PCI drug-eluting stenting of the AV groove circumflex and OM 2.  He was restudied in 2014 and found to have a patent LAD stent and diffuse RCA disease.  He currently denies chest pain or shortness of breath.  His last tress test performed 12/05/2016 showed no evidence of ischemia.

## 2023-09-29 NOTE — Patient Instructions (Signed)
 Medication Instructions:  Your physician recommends that you continue on your current medications as directed. Please refer to the Current Medication list given to you today.  *If you need a refill on your cardiac medications before your next appointment, please call your pharmacy*   Testing/Procedures: Your physician has requested that you have a carotid duplex. This test is an ultrasound of the carotid arteries in your neck. It looks at blood flow through these arteries that supply the brain with blood. Allow one hour for this exam. There are no restrictions or special instructions.  **To do in September**   Please note: We ask at that you not bring children with you during ultrasound (echo/ vascular) testing. Due to room size and safety concerns, children are not allowed in the ultrasound rooms during exams. Our front office staff cannot provide observation of children in our lobby area while testing is being conducted. An adult accompanying a patient to their appointment will only be allowed in the ultrasound room at the discretion of the ultrasound technician under special circumstances. We apologize for any inconvenience.  Your physician has requested that you have an Aorta/Iliac Duplex.   No food after 11PM the night before.  Water is OK. (Don't drink liquids if you have been instructed not to for ANOTHER test) Avoid foods that produce bowel gas, for 24 hours prior to exam (see below). No breakfast, no chewing gum, no smoking or carbonated beverages. Patient may take morning medications with water. Come in for test at least 15 minutes early to register. **To do in September**  Please note: We ask at that you not bring children with you during ultrasound (echo/ vascular) testing. Due to room size and safety concerns, children are not allowed in the ultrasound rooms during exams. Our front office staff cannot provide observation of children in our lobby area while testing is being  conducted. An adult accompanying a patient to their appointment will only be allowed in the ultrasound room at the discretion of the ultrasound technician under special circumstances. We apologize for any inconvenience.   Your physician has requested that you have a lower extremity arterial duplex. During this test, ultrasound is used to evaluate arterial blood flow in the legs. Allow one hour for this exam. There are no restrictions or special instructions. **To do in September**   Please note: We ask at that you not bring children with you during ultrasound (echo/ vascular) testing. Due to room size and safety concerns, children are not allowed in the ultrasound rooms during exams. Our front office staff cannot provide observation of children in our lobby area while testing is being conducted. An adult accompanying a patient to their appointment will only be allowed in the ultrasound room at the discretion of the ultrasound technician under special circumstances. We apologize for any inconvenience.   Your physician has requested that you have an ankle brachial index (ABI). During this test an ultrasound and blood pressure cuff are used to evaluate the arteries that supply the arms and legs with blood. Allow thirty minutes for this exam. There are no restrictions or special instructions. **To do in September**   Please note: We ask at that you not bring children with you during ultrasound (echo/ vascular) testing. Due to room size and safety concerns, children are not allowed in the ultrasound rooms during exams. Our front office staff cannot provide observation of children in our lobby area while testing is being conducted. An adult accompanying a patient to their appointment  will only be allowed in the ultrasound room at the discretion of the ultrasound technician under special circumstances. We apologize for any inconvenience.     Follow-Up: At [facility], you and your health needs  are our priority.  As part of our continuing mission to provide you with exceptional heart care, we have created designated Provider Care Teams.  These Care Teams include your primary Cardiologist (physician) and Advanced Practice Providers (APPs -  Physician Assistants and Nurse Practitioners) who all work together to provide you with the care you need, when you need it.  We recommend signing up for the patient portal called "MyChart".  Sign up information is provided on this After Visit Summary.  MyChart is used to connect with patients for Virtual Visits (Telemedicine).  Patients are able to view lab/test results, encounter notes, upcoming appointments, etc.  Non-urgent messages can be sent to your provider as well.   To learn more about what you can do with MyChart, go to [email or web address].    Your next appointment:   12 month(s)  Provider:   Nanetta Batty, MD     Other Instructions   1st Floor: - Lobby - Registration  - Pharmacy  - Lab - Cafe  2nd Floor: - PV Lab - Diagnostic Testing (echo, CT, nuclear med)  3rd Floor: - Vacant  4th Floor: - TCTS (cardiothoracic surgery) - AFib Clinic - Structural Heart Clinic - Vascular Surgery  - Vascular Ultrasound  5th Floor: - HeartCare Cardiology (general and EP) - Clinical Pharmacy for coumadin, hypertension, lipid, weight-loss medications, and med management appointments    Valet parking services will be available as well.

## 2023-09-29 NOTE — Assessment & Plan Note (Signed)
 Discontinued

## 2023-09-29 NOTE — Assessment & Plan Note (Signed)
 History of dyslipidemia on statin therapy and Zetia followed by his PCP.

## 2023-09-29 NOTE — Progress Notes (Signed)
 09/29/2023 Austin Mclaughlin   1945-10-01  [id number]  Primary Physician Ralene Ok, MD Primary Cardiologist: [name] MD FACP, [place], [place], [place]  HPI:  Austin Mclaughlin is a 78 y.o.  married, father of 4 who I last saw in the office   02/26/2021. He has a history of CAD, s/p cath in 2008 after a stress test for CP showed ischemia. The cath, at that time, demonstrated an occluded PDA and collaterals with high grade OM2 disease and proximal segmental AV groove left circ disease. He underwent PCI of the AV groove left circ and OM2 with DES. He had reportedly done well until developing chest pain the day prior to arrival to the ED. The CP had been intermittent for over the course of 24 hours, before recurring and prompting the patient to seek evaluation. He described the pain as a sharp, stinging pain in his left chest with no SOB, nausea or diaphoresis. At the [facility] ER he was noted to have 4mm of ST elevation in V1 and V2 and a STEMI was called and he was transferred to [facility]. On arrival to [facility], his chest pain resolved. He was taken urgently to the cath lab. The procedure was performed by Dr. Allyson Sabal via the right femoral artery. It demonstrated a patent LAD stent and diffuse RCA disease. Medical therapy was recommended. He left the cath lab in stable condition. Post-cath, he was noted to have moderate hypertension. He was placed on Lopressor, HCTZ and lisinopril. He had mild improvement in BP. The patient saw [facility] back in the office the Monday after discharge and his lisinopril was up titrated. Since that time he's had no chest pain and for some reason no longer is on an ACE inhibitor. He also has stopped smoking several months ago after smoking one pack per day for 55 years.    He was evaluated in the emergency room at Dr. Elease Hashimoto for chest pain. His enzymes were negative and a Myoview stress test was low risk. He has had no recurrent chest pain since that time. He does complain of some calf  claudication bilaterally which is lifestyle limiting. He did have segmental pressures performed at [facility] 12/02/16 revealing a right ABI 0.54 and a left of 0.74. These did drop to .15 and .44 respectively with exercise. Based on this, I decided to proceed with duplex ultrasound and potential endovascular therapy. This was performed 01/26/17 revealing a right ABI 0.53 with a high-frequency signal of the origin of his right SFA and a left ABI 0.75 with an occluded mid left SFA.    I performed angiography and intervention on him 02/26/17. I performed a diamond echo orbital facial atherectomy followed by V BX cover stenting of a calcified high-grade left common iliac artery stenosis in addition to [facility] one directional atherectomy and drug-eluting glandular plasty of his entire right SFA. His claudication resolved and his Dopplers improved.   Is recently seen in the emergency room with chest pain last week after lifting heavy objects at work. His pain has not recurred. He no longer has claudication either.   Since I saw him in the office 2-1/2 years ago he continues to do well.  He denies chest pain shortness of breath or claudication.  Recent carotid Doppler showed moderate right ICA stenosis.  Lower extremity Dopplers revealed a patent left common iliac artery stent with severe disease in his right SFA.   Current Meds  Medication Sig   amLODipine-benazepril (LOTREL)  10-40 MG capsule Take 1 capsule by mouth daily.   aspirin EC 81 MG EC tablet Take 1 tablet (81 mg total) by mouth daily.   atorvastatin (LIPITOR) 80 MG tablet Take 80 mg by mouth daily.    clopidogrel (PLAVIX) 75 MG tablet Take 75 mg by mouth daily.   ezetimibe (ZETIA) 10 MG tablet TAKE 1 TABLET(10 MG) BY MOUTH DAILY   hydrochlorothiazide (HYDRODIURIL) 25 MG tablet Take 12.5 mg by mouth daily.    Insulin Glargine-Lixisenatide 100-33 UNT-MCG/ML SOPN Inject 40 Units into the skin daily.   magnesium oxide (MAG-OX) 400 MG tablet Take 400  mg by mouth daily.   metoprolol succinate (TOPROL-XL) 25 MG 24 hr tablet TAKE 1 TABLET(25 MG) BY MOUTH DAILY   nitroGLYCERIN (NITROSTAT) 0.4 MG SL tablet Place 1 tablet (0.4 mg total) under the tongue every 5 (five) minutes x 3 doses as needed for chest pain.   pantoprazole (PROTONIX) 40 MG tablet Take 1 tablet by mouth daily.   Potassium Chloride ER 20 MEQ TBCR Take 20 mEq by mouth daily.     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  Tobacco Use   Smoking status: Former    Current packs/day: 0.00    Average packs/day: 1 pack/day for 39.0 years (39.0 ttl pk-yrs)    Types: Cigarettes    Start date: 04/18/1975    Quit date: 04/17/2014    Years since quitting: 9.4   Smokeless tobacco: Never   Tobacco comments:    every once in a while  Substance and Sexual Activity   Alcohol use: Yes    Alcohol/week: 14.0 standard drinks of alcohol    Types: 12 Cans of beer, 2 Shots of liquor per week    Comment: 02/17/2013 "6 beers/weekend day"   Drug use: No   Sexual activity: Yes  Other Topics Concern   Not on file  Social History Narrative   Not on file   Social Drivers of Health   Financial Resource Strain: Not on file  Food Insecurity: Not on file  Transportation Needs: Not on file  Physical Activity: Not on file  Stress: Not on file  Social Connections: Not on file  Intimate Partner Violence: Not on file     Review of Systems: General: negative for chills, fever, night sweats or weight changes.  Cardiovascular: negative for chest pain, dyspnea on exertion, edema, orthopnea, palpitations, paroxysmal nocturnal dyspnea or shortness of breath Dermatological: negative for rash Respiratory:  negative for cough or wheezing Urologic: negative for hematuria Abdominal: negative for nausea, vomiting, diarrhea, bright red blood per rectum, melena, or hematemesis Neurologic: negative for visual changes, syncope, or dizziness All other systems reviewed and are otherwise negative except as noted above.    Blood pressure (!) 124/58, pulse 76, height 5\' 3"  (1.6 m), weight 176 lb 9.6 oz (80.1 kg), SpO2 98%.  General appearance: alert and no distress Neck: no adenopathy, no JVD, supple, symmetrical, trachea midline, thyroid not enlarged, symmetric, no tenderness/mass/nodules, and bilateral carotid bruits Lungs: clear to auscultation bilaterally Heart: regular rate and rhythm, S1, S2 normal, no murmur, click, rub or gallop Extremities: extremities normal, atraumatic, no cyanosis or  edema Pulses: Diminished pedal pulses Skin: Skin color, texture, turgor normal. No rashes or lesions Neurologic: Grossly normal  EKG EKG Interpretation Date/Time:  Tuesday September 29 2023 14:21:29 EDT Ventricular Rate:  76 PR Interval:  192 QRS Duration:  84 QT Interval:  366 QTC Calculation: 411 R Axis:   21  Text Interpretation: Normal sinus rhythm Normal ECG When compared with ECG of 30-Jul-2020 20:11, No significant change was found Confirmed by Nanetta Batty [phone]) on 09/29/2023 2:32:10 PM    ASSESSMENT AND PLAN:   HTN (hypertension) History of essential hypertension her blood pressure measured today at 124/58.  He is on amlodipine, benazepril and metoprolol.  Dyslipidemia, goal LDL below 70 History of dyslipidemia on statin therapy and Zetia followed by his PCP.  Tobacco abuse Discontinued  CAD S/P percutaneous coronary angioplasty History of CAD status post cardiac catheterization by myself in 2008 demonstrated an occluded PDA with collaterals and high-grade OM 2 disease in proximal AV groove circumflex disease.  He underwent PCI drug-eluting stenting of the AV groove circumflex and OM 2.   He was restudied in 2014 and found to have a patent LAD stent and diffuse RCA disease.  He currently denies chest pain or shortness of breath.  His last tress test performed 12/05/2016 showed no evidence of ischemia.  PAD (peripheral artery disease) (HCC) History of PAD status post peripheral angiography by myself 02/26/2017 after Doppler studies revealed a right ABI of 0.54 and a left of 0.74 with a high-frequency signal at the origin of his right SFA.  Angiography was notable for a high-grade calcified left common iliac artery stenosis.  He underwent orbital atherectomy and VBX stenting.  He also had diffuse right SFA disease and underwent directional atherectomy and drug-coated balloon angioplasty.  His Dopplers improved his claudication resolved.  His most recent Doppler studies performed 03/27/2023 revealed a patent left common iliac artery stent with high-grade right SFA stenosis.  His right ABI was 0.41 and left was 0.56.  He is denied claudication.  Carotid artery disease (HCC) History of moderate right ICA stenosis by duplex ultrasound performed 03/31/2023.  This will be repeated on an annual basis.     [name] MD FACP,FACC,FAHA, [facility] 09/29/2023 2:44 PM

## 2023-09-29 NOTE — Assessment & Plan Note (Signed)
 History of moderate right ICA stenosis by duplex ultrasound performed 03/31/2023.  This will be repeated on an annual basis.

## 2023-09-29 NOTE — Assessment & Plan Note (Signed)
 History of PAD status post peripheral angiography by myself 02/26/2017 after Doppler studies revealed a right ABI of 0.54 and a left of 0.74 with a high-frequency signal at the origin of his right SFA.  Angiography was notable for a high-grade calcified left common iliac artery stenosis.  He underwent orbital atherectomy and VBX stenting.  He also had diffuse right SFA disease and underwent directional atherectomy and drug-coated balloon angioplasty.  His Dopplers improved his claudication resolved.  His most recent Doppler studies performed 03/27/2023 revealed a patent left common iliac artery stent with high-grade right SFA stenosis.  His right ABI was 0.41 and left was 0.56.  He is denied claudication.

## 2023-09-29 NOTE — Assessment & Plan Note (Signed)
 History of essential hypertension her blood pressure measured today at 124/58.  He is on amlodipine, benazepril and metoprolol.

## 2024-03-10 ENCOUNTER — Ambulatory Visit (HOSPITAL_BASED_OUTPATIENT_CLINIC_OR_DEPARTMENT_OTHER)
Admission: RE | Admit: 2024-03-10 | Discharge: 2024-03-10 | Disposition: A | Discharge status: Home / Self Care | Source: Ambulatory Visit | Attending: Cardiovascular Disease | Admitting: Cardiovascular Disease

## 2024-03-10 ENCOUNTER — Ambulatory Visit (HOSPITAL_BASED_OUTPATIENT_CLINIC_OR_DEPARTMENT_OTHER)
Admission: RE | Admit: 2024-03-10 | Discharge: 2024-03-10 | Disposition: A | Discharge status: Home / Self Care | Source: Ambulatory Visit | Attending: Vascular Surgery | Admitting: Vascular Surgery

## 2024-03-10 ENCOUNTER — Ambulatory Visit (HOSPITAL_COMMUNITY)
Admission: RE | Admit: 2024-03-10 | Discharge: 2024-03-10 | Disposition: A | Discharge status: Home / Self Care | Source: Ambulatory Visit | Attending: Cardiovascular Disease | Admitting: Cardiovascular Disease

## 2024-03-10 ENCOUNTER — Ambulatory Visit (HOSPITAL_BASED_OUTPATIENT_CLINIC_OR_DEPARTMENT_OTHER)
Admission: RE | Admit: 2024-03-10 | Discharge: 2024-03-10 | Disposition: A | Discharge status: Home / Self Care | Source: Ambulatory Visit | Attending: Vascular Surgery

## 2024-03-10 DIAGNOSIS — Z95820 Peripheral vascular angioplasty status with implants and grafts: Secondary | ICD-10-CM | POA: Insufficient documentation

## 2024-03-10 DIAGNOSIS — I6521 Occlusion and stenosis of right carotid artery: Secondary | ICD-10-CM | POA: Insufficient documentation

## 2024-03-10 DIAGNOSIS — I739 Peripheral vascular disease, unspecified: Secondary | ICD-10-CM | POA: Diagnosis present

## 2024-03-11 LAB — VAS US ABI WITH/WO TBI
Left ABI: 0.74
Right ABI: 0.49

## 2024-03-12 ENCOUNTER — Ambulatory Visit: Payer: Self-pay | Admitting: Cardiovascular Disease

## 2024-03-12 DIAGNOSIS — I771 Stricture of artery: Secondary | ICD-10-CM

## 2024-03-12 DIAGNOSIS — I6521 Occlusion and stenosis of right carotid artery: Secondary | ICD-10-CM

## 2024-03-17 ENCOUNTER — Inpatient Hospital Stay (HOSPITAL_COMMUNITY): Admission: RE | Admit: 2024-03-17 | Source: Ambulatory Visit

## 2024-03-18 NOTE — Addendum Note (Signed)
 Addended by: Dru Primeau W on: 03/18/2024 03:14 PM   Modules accepted: Orders

## 2024-03-30 ENCOUNTER — Ambulatory Visit: Admitting: Cardiovascular Disease

## 2024-03-31 ENCOUNTER — Encounter (HOSPITAL_COMMUNITY)

## 2024-04-06 ENCOUNTER — Ambulatory Visit: Attending: Internal Medicine | Admitting: Cardiovascular Disease

## 2024-04-06 ENCOUNTER — Encounter: Payer: Self-pay | Admitting: Cardiovascular Disease

## 2024-04-06 VITALS — BP 130/82 | HR 72 | Resp 16 | Ht 63.0 in | Wt 177.7 lb

## 2024-04-06 DIAGNOSIS — I10 Essential (primary) hypertension: Secondary | ICD-10-CM

## 2024-04-06 DIAGNOSIS — I739 Peripheral vascular disease, unspecified: Secondary | ICD-10-CM

## 2024-04-06 DIAGNOSIS — Z9861 Coronary angioplasty status: Secondary | ICD-10-CM

## 2024-04-06 DIAGNOSIS — I2510 Atherosclerotic heart disease of native coronary artery without angina pectoris: Secondary | ICD-10-CM

## 2024-04-06 DIAGNOSIS — E785 Hyperlipidemia, unspecified: Secondary | ICD-10-CM | POA: Diagnosis not present

## 2024-04-06 DIAGNOSIS — I6521 Occlusion and stenosis of right carotid artery: Secondary | ICD-10-CM

## 2024-04-06 MED ORDER — CILOSTAZOL 50 MG PO TABS: 50.0000 mg | ORAL_TABLET | Freq: Two times a day (BID) | ORAL | 1 refills | Status: AC

## 2024-04-06 NOTE — Assessment & Plan Note (Signed)
 History of PAD status post peripheral angiography by myself 02/26/2017 revealing a high-grade calcified left common iliac artery stenosis.  I performed orbital atherectomy and VBX covered stenting Court also performed [facility] 1 directional atherectomy with drug-eluting angioplasty of his entire right SFA.  His Dopplers improved at that time.  When I saw him 6 months ago he denied claudication however recently has noticed some right calf claudication when pushing a heavy cart.  His most recent Dopplers performed 9//25 revealed a patent left iliac stent, and an occluded right SFA with stable ABIs.  I am going to begin him on Pletal 50 mg p.o. twice daily and will see him back in 6 months for follow-up.

## 2024-04-06 NOTE — Assessment & Plan Note (Signed)
 History of essential hypertension blood pressure measured today at 168/88.  When he was here 6 months ago his blood pressure was 124/58.  He is on amlodipine , benazepril , hydrochlorothiazide  and metoprolol  which he took today.

## 2024-04-06 NOTE — Progress Notes (Signed)
 04/06/2024 Austin Mclaughlin   1946/03/17  [id number]  Primary Physician Valma Carwin, MD Primary Cardiologist: Dorn JINNY Lesches MD FACP, [place], [place], [place]  HPI:  Austin Mclaughlin is a 78 y.o.   married, father of 4 who I last saw in the office   09/29/2023.  He is accompanied by his wife Austin Mclaughlin today.SABRA He has a history of CAD, s/p cath in 2008 after a stress test for CP showed ischemia. The cath, at that time, demonstrated an occluded PDA and collaterals with high grade OM2 disease and proximal segmental AV groove left circ disease. He underwent PCI of the AV groove left circ and OM2 with DES. He had reportedly done well until developing chest pain the day prior to arrival to the ED. The CP had been intermittent for over the course of 24 hours, before recurring and prompting the patient to seek evaluation. He described the pain as a sharp, stinging pain in his left chest with no SOB, nausea or diaphoresis. At the [facility] he was noted to have 4mm of ST elevation in V1 and V2 and a STEMI was called and he was transferred to [facility]. On arrival to [facility], his chest pain resolved. He was taken urgently to the cath lab. The procedure was performed by Dr. Lesches via the right femoral artery. It demonstrated a patent LAD stent and diffuse RCA disease. Medical therapy was recommended. He left the cath lab in stable condition. Post-cath, he was noted to have moderate hypertension. He was placed on Lopressor , HCTZ and lisinopril . He had mild improvement in BP. The patient saw [facility] Sow [facility] back in the office the Monday after discharge and his lisinopril  was up titrated. Since that time he's had no chest pain and for some reason no longer is on an ACE inhibitor. He also has stopped smoking several months ago after smoking one pack per day for 55 years.    He was evaluated in the emergency room at Dr. Alveta for chest pain. His enzymes were negative and a Myoview  stress test was low risk. He has had no recurrent chest pain  since that time. He does complain of some calf claudication bilaterally which is lifestyle limiting. He did have segmental pressures performed at [facility] 12/02/16 revealing a right ABI 0.54 and a left of 0.74. These did drop to .15 and .44 respectively with exercise. Based on this, I decided to proceed with duplex ultrasound and potential endovascular therapy. This was performed 01/26/17 revealing a right ABI 0.53 with a high-frequency signal of the origin of his right SFA and a left ABI 0.75 with an occluded mid left SFA.    I performed angiography and intervention on him 02/26/17. I performed a diamond echo orbital facial atherectomy followed by V BX cover stenting of a calcified high-grade left common iliac artery stenosis in addition to [facility] one directional atherectomy and drug-eluting glandular plasty of his entire right SFA. His claudication resolved and his Dopplers improved.   Is recently seen in the emergency room with chest pain last week after lifting heavy objects at work. His pain has not recurred. He no longer has claudication either.   Since I saw him in the office 6 months ago his remained stable.  He denies chest pain or shortness of breath.  He has however noticed new onset right calf claudication when pushing a heavy cart.  He had Dopplers performed 9//25 that revealed a patent left iliac stent and an occluded right  SFA.  He also has moderate right internal carotid artery stenosis.   Current Meds  Medication Sig   amLODipine -benazepril  (LOTREL) 10-40 MG capsule Take 1 capsule by mouth daily.   aspirin  EC 81 MG EC tablet Take 1 tablet (81 mg total) by mouth daily.   atorvastatin  (LIPITOR) 80 MG tablet Take 80 mg by mouth daily.    clopidogrel  (PLAVIX ) 75 MG tablet Take 75 mg by mouth daily.   ezetimibe  (ZETIA ) 10 MG tablet TAKE 1 TABLET(10 MG) BY MOUTH DAILY   hydrochlorothiazide  (HYDRODIURIL ) 25 MG tablet Take 25 mg by mouth daily.   Insulin  Glargine-Lixisenatide 100-33  UNT-MCG/ML SOPN Inject 40 Units into the skin daily.   magnesium  oxide (MAG-OX) 400 MG tablet Take 400 mg by mouth daily.   metoprolol  succinate (TOPROL -XL) 25 MG 24 hr tablet TAKE 1 TABLET(25 MG) BY MOUTH DAILY   nitroGLYCERIN  (NITROSTAT ) 0.4 MG SL tablet Place 1 tablet (0.4 mg total) under the tongue every 5 (five) minutes x 3 doses as needed for chest pain.   pantoprazole  (PROTONIX ) 40 MG tablet Take 1 tablet by mouth daily.   Potassium Chloride  ER 20 MEQ TBCR Take 20 mEq by mouth daily.     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  Tobacco Use   Smoking status: Former    Current packs/day: 0.00    Average packs/day: 1 pack/day for 39.0 years (39.0 ttl pk-yrs)    Types: Cigarettes    Start date: 04/18/1975    Quit date: 04/17/2014    Years since quitting: 9.9   Smokeless tobacco: Never   Tobacco comments:    every once in a while  Substance and Sexual Activity   Alcohol use: Yes    Alcohol/week: 14.0 standard drinks of alcohol    Types: 12 Cans of beer, 2 Shots of liquor per week    Comment: 02/17/2013 6 beers/weekend day   Drug use: No   Sexual activity: Yes  Other Topics Concern   Not on file  Social History Narrative   Not on file   Social Drivers of Health   Financial Resource Strain: Not on file  Food Insecurity: Not on file  Transportation Needs: Not on file  Physical Activity: Not on file  Stress: Not on file  Social Connections: Not on file  Intimate Partner Violence: Not on file     Review of Systems: General: negative for chills, fever, night sweats or weight changes.  Cardiovascular: negative for chest pain, dyspnea on exertion, edema, orthopnea, palpitations,  paroxysmal nocturnal dyspnea or shortness of breath Dermatological: negative for rash Respiratory: negative for cough or wheezing Urologic: negative for hematuria Abdominal: negative for nausea, vomiting, diarrhea, bright red blood per rectum, melena, or hematemesis Neurologic: negative for visual changes, syncope, or dizziness All other systems reviewed and are otherwise negative except as noted above.    Blood pressure (!) 168/88, pulse 72, resp. rate 16, height 5' 3 (1.6 m), weight 177 lb 11.2 oz (80.6 kg), SpO2 96%.  General appearance: alert and no distress Neck: no adenopathy, no JVD, supple, symmetrical, trachea midline, thyroid not enlarged, symmetric, no tenderness/mass/nodules, and soft right carotid bruit Lungs:  clear to auscultation bilaterally Heart: regular rate and rhythm, S1, S2 normal, no murmur, click, rub or gallop Extremities: extremities normal, atraumatic, no cyanosis or edema Pulses: Diminished pedal pulses Skin: Skin color, texture, turgor normal. No rashes or lesions Neurologic: Grossly normal  EKG EKG Interpretation Date/Time:  Wednesday April 06 2024 15:46:08 EDT Ventricular Rate:  80 PR Interval:  182 QRS Duration:  82 QT Interval:  356 QTC Calculation: 410 R Axis:   -17  Text Interpretation: Normal sinus rhythm Cannot rule out Anterior infarct , age undetermined When compared with ECG of 29-Sep-2023 14:21, Minimal criteria for Anterior infarct are now Present Confirmed by Court Carrier [id number]) on 04/06/2024 3:47:14 PM    ASSESSMENT AND PLAN:   HTN (hypertension) History of essential hypertension blood pressure measured today at 168/88.  When he was here 6 months ago his blood pressure was 124/58.  He is on amlodipine , benazepril , hydrochlorothiazide  and metoprolol  which he took today.  CAD S/P percutaneous coronary angioplasty History of CAD status post cath in 2008 revealing an occluded PDA with collaterals, high-grade OM2 disease and proximal  segmental AV groove circumflex disease.  He underwent PCI of the AV groove circumflex and LMT with a drug-eluting stent.  He had anterior STEMI in 2014 and found to have a patent LAD stent and diffuse RCA disease.  His last stress test performed 12/05/2016 was nonischemic.  He currently denies chest pain or shortness of breath.  Dyslipidemia, goal LDL below 70 History of hyperlipidemia on high-dose statin therapy and Zetia  followed by his PCP.  PAD (peripheral artery disease) History of PAD status post peripheral angiography by myself 02/26/2017 revealing a high-grade calcified left common iliac artery stenosis.  I performed orbital atherectomy and VBX covered stenting Court also performed [facility] 1 directional atherectomy with drug-eluting angioplasty of his entire right SFA.  His Dopplers improved at that time.  When I saw him 6 months ago he denied claudication however recently has noticed some right calf claudication when pushing a heavy cart.  His most recent Dopplers performed 9//25 revealed a patent left iliac stent, and an occluded right SFA with stable ABIs.  I am going to begin him on Pletal 50 mg p.o. twice daily and will see him back in 6 months for follow-up.  Carotid artery disease Moderate right internal carotid artery stenosis by duplex ultrasound performed 9//25.  This will be repeated on an annual basis.     Carrier DOROTHA Court MD FACP,FACC,FAHA, [facility] 04/06/2024 4:01 PM

## 2024-04-06 NOTE — Assessment & Plan Note (Signed)
 History of CAD status post cath in 2008 revealing an occluded PDA with collaterals, high-grade OM2 disease and proximal segmental AV groove circumflex disease.  He underwent PCI of the AV groove circumflex and LMT with a drug-eluting stent.  He had anterior STEMI in 2014 and found to have a patent LAD stent and diffuse RCA disease.  His last stress test performed 12/05/2016 was nonischemic.  He currently denies chest pain or shortness of breath.

## 2024-04-06 NOTE — Patient Instructions (Addendum)
 Medication Instructions:  Your physician has recommended you make the following change in your medication:   -Start taking cilostazol (pletal) 50mg  twice daily.  *If you need a refill on your cardiac medications before your next appointment, please call your pharmacy*   Follow-Up: At [facility], you and your health needs are our priority.  As part of our continuing mission to provide you with exceptional heart care, our providers are all part of one team.  This team includes your primary Cardiologist (physician) and Advanced Practice Providers or APPs (Physician Assistants and Nurse Practitioners) who all work together to provide you with the care you need, when you need it.  Your next appointment:   3 month(s)  Provider:   Dorn Lesches, MD    We recommend signing up for the patient portal called MyChart.  Sign up information is provided on this After Visit Summary.  MyChart is used to connect with patients for Virtual Visits (Telemedicine).  Patients are able to view lab/test results, encounter notes, upcoming appointments, etc.  Non-urgent messages can be sent to your provider as well.   To learn more about what you can do with MyChart, go to [email or web address].

## 2024-04-06 NOTE — Assessment & Plan Note (Signed)
History of hyperlipidemia on high-dose statin therapy and Zetia followed by his PCP

## 2024-04-06 NOTE — Assessment & Plan Note (Signed)
 Moderate right internal carotid artery stenosis by duplex ultrasound performed 9//25.  This will be repeated on an annual basis.

## 2024-04-09 ENCOUNTER — Encounter (HOSPITAL_BASED_OUTPATIENT_CLINIC_OR_DEPARTMENT_OTHER): Payer: Self-pay

## 2024-04-09 ENCOUNTER — Emergency Department (HOSPITAL_BASED_OUTPATIENT_CLINIC_OR_DEPARTMENT_OTHER)
Admission: EM | Admit: 2024-04-09 | Discharge: 2024-04-09 | Disposition: A | Discharge status: Home / Self Care | Attending: Emergency Medicine | Admitting: Emergency Medicine

## 2024-04-09 ENCOUNTER — Other Ambulatory Visit: Payer: Self-pay

## 2024-04-09 DIAGNOSIS — Z794 Long term (current) use of insulin: Secondary | ICD-10-CM | POA: Diagnosis not present

## 2024-04-09 DIAGNOSIS — Z7901 Long term (current) use of anticoagulants: Secondary | ICD-10-CM | POA: Insufficient documentation

## 2024-04-09 DIAGNOSIS — Z7982 Long term (current) use of aspirin: Secondary | ICD-10-CM | POA: Insufficient documentation

## 2024-04-09 DIAGNOSIS — H5789 Other specified disorders of eye and adnexa: Secondary | ICD-10-CM | POA: Insufficient documentation

## 2024-04-09 MED ORDER — TETRACAINE HCL 0.5 % OP SOLN
2.0000 [drp] | Freq: Once | OPHTHALMIC | Status: DC
  Filled 2024-04-09: qty 4

## 2024-04-09 MED ORDER — ERYTHROMYCIN 5 MG/GM OP OINT: TOPICAL_OINTMENT | OPHTHALMIC | 0 refills | Status: AC

## 2024-04-09 MED ORDER — FLUORESCEIN SODIUM 1 MG OP STRP
1.0000 | ORAL_STRIP | Freq: Once | OPHTHALMIC | Status: DC
  Filled 2024-04-09: qty 1

## 2024-04-09 NOTE — Discharge Instructions (Addendum)
 Your symptoms today may be due to a bacterial infection of your right eye.  If your symptoms resolve, you do not need to start any medication, however if your symptoms persist start erythromycin ointment applied into the right eye every 6 hours for 7 days.  Follow-up with your ophthalmologist/optometrist if your symptoms persist.  Return to the emergency department if your symptoms worsen.

## 2024-04-09 NOTE — ED Provider Notes (Signed)
 [facility] AT [facility] Provider Note   CSN: [id number] Arrival date & time: 04/09/24  1357     Patient presents with: Eye Problem (right)   Austin Mclaughlin is a 78 y.o. male.   78 year old male presenting with eye complaint.  Patient reports around 7 AM he started to have some tearing and soreness of his right eye, when he was driving to the emergency department the soreness resolved, however he does note some mild tearing still.  Denies any vision changes.  History of cataract surgery bilaterally but no glaucoma history. He did note some mild nasal congestion this morning, which has also resolved. No other complaints.    Eye Problem      Prior to Admission medications   Medication Sig Start Date End Date Taking? Authorizing Provider  amLODipine -benazepril  (LOTREL) 10-40 MG capsule Take 1 capsule by mouth daily. 09/24/16   [provider]  aspirin  EC 81 MG EC tablet Take 1 tablet (81 mg total) by mouth daily. 02/17/13   Marcine Catalan M, PA-C  atorvastatin  (LIPITOR) 80 MG tablet Take 80 mg by mouth daily.  12/31/16   [provider]  cilostazol (PLETAL) 50 MG tablet Take 1 tablet (50 mg total) by mouth 2 (two) times daily. 04/06/24   Court Dorn PARAS, MD  clopidogrel  (PLAVIX ) 75 MG tablet Take 75 mg by mouth daily.    [provider]  ezetimibe  (ZETIA ) 10 MG tablet TAKE 1 TABLET(10 MG) BY MOUTH DAILY 03/26/23   Court Dorn PARAS, MD  hydrochlorothiazide  (HYDRODIURIL ) 25 MG tablet Take 25 mg by mouth daily. 11/05/15   [provider]  Insulin  Glargine-Lixisenatide 100-33 UNT-MCG/ML SOPN Inject 40 Units into the skin daily.    [provider]  magnesium  oxide (MAG-OX) 400 MG tablet Take 400 mg by mouth daily.    [provider]  metoprolol  succinate (TOPROL -XL) 25 MG 24 hr tablet TAKE 1 TABLET(25 MG) BY MOUTH DAILY 01/15/18   Court Dorn PARAS, MD  nitroGLYCERIN  (NITROSTAT ) 0.4 MG SL tablet Place 1 tablet  (0.4 mg total) under the tongue every 5 (five) minutes x 3 doses as needed for chest pain. 02/17/13   Marcine Catalan M, PA-C  pantoprazole  (PROTONIX ) 40 MG tablet Take 1 tablet by mouth daily. 09/18/17   [provider]  Potassium Chloride  ER 20 MEQ TBCR Take 20 mEq by mouth daily. 01/23/17   [provider]    Allergies: Patient has no known allergies.    Review of Systems  Updated Vital Signs  Vitals:   04/09/24 1424 04/09/24 1424 04/09/24 1425  BP:   (!) 178/94  Pulse:   78  Resp:   20  Temp:   98.3 F (36.8 C)  TempSrc:   Temporal  SpO2: 100%  100%  Weight:  80.6 kg   Height:  5' 3 (1.6 m)      Physical Exam Vitals and nursing note reviewed.  HENT:     Head: Normocephalic.  Eyes:     General:        Right eye: Discharge present.     Extraocular Movements: Extraocular movements intact.     Pupils: Pupils are equal, round, and reactive to light.     Comments: Mild conjunctival injection bilaterally Clear tearing of R eye No fluorescein uptake OD IOP OD: 14 IOP OS: 24  Cardiovascular:     Rate and Rhythm: Normal rate.  Pulmonary:     Effort: Pulmonary effort is normal.  Musculoskeletal:  Cervical back: Normal range of motion.     Comments: Moves all extremities spontaneously without difficulty  Skin:    General: Skin is warm and dry.  Neurological:     Mental Status: He is alert and oriented to person, place, and time.     (all labs ordered are listed, but only abnormal results are displayed) Labs Reviewed - No data to display  EKG: None  Radiology: No results found.   Procedures   Medications Ordered in the ED  fluorescein ophthalmic strip 1 strip (has no administration in time range)  tetracaine (PONTOCAINE) 0.5 % ophthalmic solution 2 drop (has no administration in time range)                                    Medical Decision Making This patient presents to the ED for concern of eye irritation, this involves an  extensive number of treatment options, and is a complaint that carries with it a high risk of complications and morbidity.  The differential diagnosis includes bacterial conjunctivitis, viral conjunctivitis, foreign body, acute angle-closure glaucoma   Co morbidities that complicate the patient evaluation  Hypertension, type 2 diabetes   Additional history obtained:  Additional history obtained from record review External records from outside source obtained and reviewed including recent PCP note   Cardiac Monitoring: / EKG:  The patient was maintained on a cardiac monitor.  I personally viewed and interpreted the cardiac monitored which showed an underlying rhythm of: NSR   Problem List / ED Course / Critical interventions / Medication management I have reviewed the patients home medicines and have made adjustments as needed   Social Determinants of Health:  Former tobacco use   Test / Admission - Considered:  Visual acuity obtained by nursing staff: Bilateral Distance: 20/40 R Distance: 20/40 L Distance: 20/40 Physical exam notable as above, patient does have bilateral conjunctival injection and clear tearing of the right eye, no foreign body/fluorescein uptake noted on exam, IOP OD 14 with IOP OS 24, no history of glaucoma.  Patient is established with an optometrist/ophthalmologist, I recommend he follow-up as needed if his symptoms persist.  His symptoms today are likely secondary to viral versus bacterial conjunctivitis, however he does note that his symptoms have significantly improved since this morning.  Will prescribe erythromycin in the event that symptoms do not resolve.  Patient voiced understanding and is in agreement with this plan.  Return precautions discussed, he is appropriate for discharge at this time.     Risk Prescription drug management.        Final diagnoses:  Eye irritation    ED Discharge Orders          Ordered    erythromycin ophthalmic  ointment        04/09/24 1559               Glendia Rocky SAILOR, PA-C 04/09/24 1604    Patsey Lot, MD 04/09/24 [phone]

## 2024-04-09 NOTE — ED Notes (Signed)
 Patient given discharge instructions. Questions were answered. Patient verbalized understanding of discharge instructions and care at home.

## 2024-04-09 NOTE — ED Triage Notes (Signed)
 Arrives ambulatory to the ED with complaints of right eye pain and drainage that started today. No blurred vision.

## 2024-06-17 ENCOUNTER — Emergency Department (HOSPITAL_COMMUNITY)
Admission: EM | Admit: 2024-06-17 | Discharge: 2024-06-17 | Discharge status: Against Medical Advice | Source: Ambulatory Visit | Attending: Emergency Medicine | Admitting: Emergency Medicine

## 2024-06-17 ENCOUNTER — Other Ambulatory Visit: Payer: Self-pay

## 2024-06-17 ENCOUNTER — Telehealth: Payer: Self-pay | Admitting: Cardiovascular Disease

## 2024-06-17 ENCOUNTER — Encounter (HOSPITAL_COMMUNITY): Payer: Self-pay

## 2024-06-17 ENCOUNTER — Emergency Department (HOSPITAL_COMMUNITY)

## 2024-06-17 DIAGNOSIS — R079 Chest pain, unspecified: Secondary | ICD-10-CM | POA: Insufficient documentation

## 2024-06-17 DIAGNOSIS — Z5321 Procedure and treatment not carried out due to patient leaving prior to being seen by health care provider: Secondary | ICD-10-CM | POA: Insufficient documentation

## 2024-06-17 LAB — CBC
HCT: 37 % — ABNORMAL LOW (ref 39.0–52.0)
Hemoglobin: 12.8 g/dL — ABNORMAL LOW (ref 13.0–17.0)
MCH: 31 pg (ref 26.0–34.0)
MCHC: 34.6 g/dL (ref 30.0–36.0)
MCV: 89.6 fL (ref 80.0–100.0)
Platelets: 251 K/uL (ref 150–400)
RBC: 4.13 MIL/uL — ABNORMAL LOW (ref 4.22–5.81)
RDW: 12 % (ref 11.5–15.5)
WBC: 4.7 K/uL (ref 4.0–10.5)
nRBC: 0 % (ref 0.0–0.2)

## 2024-06-17 LAB — BASIC METABOLIC PANEL WITH GFR
Anion gap: 11 (ref 5–15)
BUN: 18 mg/dL (ref 8–23)
CO2: 27 mmol/L (ref 22–32)
Calcium: 8.7 mg/dL — ABNORMAL LOW (ref 8.9–10.3)
Chloride: 95 mmol/L — ABNORMAL LOW (ref 98–111)
Creatinine, Ser: 1.1 mg/dL (ref 0.61–1.24)
GFR, Estimated: >60 mL/min (ref >60)
Glucose, Bld: 445 mg/dL — ABNORMAL HIGH (ref 70–99)
Potassium: 3.1 mmol/L — ABNORMAL LOW (ref 3.5–5.1)
Sodium: 133 mmol/L — ABNORMAL LOW (ref 135–145)

## 2024-06-17 LAB — TROPONIN I (HIGH SENSITIVITY): Troponin I (High Sensitivity): 12 ng/L (ref <18)

## 2024-06-17 NOTE — Telephone Encounter (Signed)
 Pt c/o of Chest Pain: STAT if active (IN THIS MOMENT) CP, including tightness, pressure, jaw pain, shoulder/upper arm/back pain, SOB, nausea, and vomiting.  1. Are you having CP right now (tightness, pressure, or discomfort)? Sharp pain   2. Are you experiencing any other symptoms (ex. SOB, nausea, vomiting, sweating)? No   3. How long have you been experiencing CP? Yesterday   4. Is your CP continuous or coming and going?  Continuous  5. Have you taken Nitroglycerin ? No   6. If CP returns before callback, please consider calling 911. ?

## 2024-06-17 NOTE — Telephone Encounter (Signed)
 STAT triage call:  Patient reports active chest pain at time of call, described as sharp pain. Patient states chest pain began yesterday. Pain has been continuous since onset. Patient states he has NTG but has not taken. Patient denies shortness of breath,  chest pressure, dizziness, nausea. States he isn't doing a specific activity when this happens. Due to report of active, continuous chest pain since yesterday, patient advised to seek immediate medical evaluation/emergency care. Advised if he doesn't have someone to drive him to call [phone]. Pt states he will go to the ER.

## 2024-06-17 NOTE — ED Provider Triage Note (Signed)
 Emergency Medicine Provider Triage Evaluation Note  Austin Mclaughlin , a 78 y.o. male  was evaluated in triage.  Pt complains of intermittent chest pain since yesterday, pain-free at this time, no shortness of breath swelling of the legs or diaphoresis, history of coronary disease status post stenting on Plavix  and aspirin .  Review of Systems  Positive: Chest pain intermittently but pain-free at this time Negative: Swelling of the legs shortness of breath coughing fevers chills  Physical Exam  BP (!) 152/78 (BP Location: Right Arm)   Pulse 94   Temp 98.6 F (37 C)   Resp 16   Ht 1.626 m (5' 4)   Wt 79.8 kg   SpO2 100%   BMI 30.21 kg/m  Gen:   Awake, no distress   Resp:  Normal effort  Cardiac: Normal heart, normal pulses, normal rhythm, no murmurs, no edema MSK:   Moves extremities without difficulty normal gait Other:  Well-appearing, asymptomatic  Medical Decision Making  Medically screening exam initiated at 11:39 AM.  Appropriate orders placed.  Melinda Pottinger was informed that the remainder of the evaluation will be completed by another provider, this initial triage assessment does not replace that evaluation, and the importance of remaining in the ED until their evaluation is complete.  Asymptomatic at this time, intermittent chest pain, EKG overall unremarkable, patient will need evaluation with labs   Cleotilde Rogue, MD 06/17/24 1139

## 2024-06-17 NOTE — ED Notes (Signed)
Pt stated they were leaving  

## 2024-06-17 NOTE — ED Triage Notes (Addendum)
 Pt c.o intermittent chest pain since yesterday. Denies pain currently. No other associated symptoms. Pt given 1 baby ASA at work earlier today

## 2024-07-23 ENCOUNTER — Other Ambulatory Visit: Payer: Self-pay

## 2024-07-23 ENCOUNTER — Encounter (HOSPITAL_BASED_OUTPATIENT_CLINIC_OR_DEPARTMENT_OTHER): Payer: Self-pay | Admitting: Emergency Medicine

## 2024-07-23 ENCOUNTER — Emergency Department (HOSPITAL_BASED_OUTPATIENT_CLINIC_OR_DEPARTMENT_OTHER): Admission: EM | Admit: 2024-07-23 | Discharge: 2024-07-23 | Disposition: A | Discharge status: Home / Self Care

## 2024-07-23 ENCOUNTER — Emergency Department (HOSPITAL_BASED_OUTPATIENT_CLINIC_OR_DEPARTMENT_OTHER)

## 2024-07-23 DIAGNOSIS — I2510 Atherosclerotic heart disease of native coronary artery without angina pectoris: Secondary | ICD-10-CM | POA: Insufficient documentation

## 2024-07-23 DIAGNOSIS — Z7982 Long term (current) use of aspirin: Secondary | ICD-10-CM | POA: Insufficient documentation

## 2024-07-23 DIAGNOSIS — Z7901 Long term (current) use of anticoagulants: Secondary | ICD-10-CM | POA: Insufficient documentation

## 2024-07-23 DIAGNOSIS — I10 Essential (primary) hypertension: Secondary | ICD-10-CM | POA: Insufficient documentation

## 2024-07-23 DIAGNOSIS — E119 Type 2 diabetes mellitus without complications: Secondary | ICD-10-CM | POA: Insufficient documentation

## 2024-07-23 DIAGNOSIS — Z79899 Other long term (current) drug therapy: Secondary | ICD-10-CM | POA: Insufficient documentation

## 2024-07-23 DIAGNOSIS — Z794 Long term (current) use of insulin: Secondary | ICD-10-CM | POA: Insufficient documentation

## 2024-07-23 DIAGNOSIS — M542 Cervicalgia: Secondary | ICD-10-CM | POA: Insufficient documentation

## 2024-07-23 LAB — RESP PANEL BY RT-PCR (RSV, FLU A&B, COVID)  RVPGX2
Influenza A by PCR: NEGATIVE
Influenza B by PCR: NEGATIVE
Resp Syncytial Virus by PCR: NEGATIVE
SARS Coronavirus 2 by RT PCR: NEGATIVE

## 2024-07-23 LAB — COMPREHENSIVE METABOLIC PANEL WITH GFR
ALT: 9 U/L (ref 0–44)
AST: 20 U/L (ref 15–41)
Albumin: 3.7 g/dL (ref 3.5–5.0)
Alkaline Phosphatase: 85 U/L (ref 38–126)
Anion gap: 11 (ref 5–15)
BUN: 14 mg/dL (ref 8–23)
CO2: 26 mmol/L (ref 22–32)
Calcium: 9.3 mg/dL (ref 8.9–10.3)
Chloride: 98 mmol/L (ref 98–111)
Creatinine, Ser: 0.93 mg/dL (ref 0.61–1.24)
GFR, Estimated: >60 mL/min
Glucose, Bld: 331 mg/dL — ABNORMAL HIGH (ref 70–99)
Potassium: 3.6 mmol/L (ref 3.5–5.1)
Sodium: 135 mmol/L (ref 135–145)
Total Bilirubin: 0.7 mg/dL (ref 0.0–1.2)
Total Protein: 7.3 g/dL (ref 6.5–8.1)

## 2024-07-23 LAB — CBC WITH DIFFERENTIAL/PLATELET
Abs Immature Granulocytes: 0.01 K/uL (ref 0.00–0.07)
Basophils Absolute: 0 K/uL (ref 0.0–0.1)
Basophils Relative: 1 %
Eosinophils Absolute: 0.1 K/uL (ref 0.0–0.5)
Eosinophils Relative: 2 %
HCT: 35.4 % — ABNORMAL LOW (ref 39.0–52.0)
Hemoglobin: 12.1 g/dL — ABNORMAL LOW (ref 13.0–17.0)
Immature Granulocytes: 0 %
Lymphocytes Relative: 22 %
Lymphs Abs: 1.2 K/uL (ref 0.7–4.0)
MCH: 30.8 pg (ref 26.0–34.0)
MCHC: 34.2 g/dL (ref 30.0–36.0)
MCV: 90.1 fL (ref 80.0–100.0)
Monocytes Absolute: 0.6 K/uL (ref 0.1–1.0)
Monocytes Relative: 11 %
Neutro Abs: 3.4 K/uL (ref 1.7–7.7)
Neutrophils Relative %: 64 %
Platelets: 292 K/uL (ref 150–400)
RBC: 3.93 MIL/uL — ABNORMAL LOW (ref 4.22–5.81)
RDW: 13.3 % (ref 11.5–15.5)
WBC: 5.3 K/uL (ref 4.0–10.5)
nRBC: 0 % (ref 0.0–0.2)

## 2024-07-23 MED ORDER — DICLOFENAC SODIUM 1 % EX GEL: 2.0000 g | Freq: Four times a day (QID) | CUTANEOUS | 0 refills | Status: AC

## 2024-07-23 MED ORDER — HYDROCODONE-ACETAMINOPHEN 5-325 MG PO TABS
1.0000 | ORAL_TABLET | Freq: Once | ORAL | Status: AC
  Administered 2024-07-23: 1 via ORAL
  Filled 2024-07-23: qty 1

## 2024-07-23 MED ORDER — ONDANSETRON 4 MG PO TBDP
8.0000 mg | ORAL_TABLET | Freq: Once | ORAL | Status: AC
  Administered 2024-07-23: 8 mg via ORAL
  Filled 2024-07-23: qty 2

## 2024-07-23 MED ORDER — HYDROCODONE-ACETAMINOPHEN 5-325 MG PO TABS: 1.0000 | ORAL_TABLET | Freq: Four times a day (QID) | ORAL | 0 refills | Status: AC | PRN

## 2024-07-23 MED ORDER — DICLOFENAC SODIUM 1 % EX GEL: 2.0000 g | Freq: Four times a day (QID) | CUTANEOUS | 0 refills | Status: DC

## 2024-07-23 MED ORDER — LIDOCAINE 5 % EX PTCH: 1.0000 | MEDICATED_PATCH | CUTANEOUS | 0 refills | Status: AC

## 2024-07-23 MED ORDER — IOHEXOL 350 MG/ML SOLN
75.0000 mL | Freq: Once | INTRAVENOUS | Status: AC | PRN
  Administered 2024-07-23: 75 mL via INTRAVENOUS

## 2024-07-23 MED ORDER — LIDOCAINE 5 % EX PTCH
1.0000 | MEDICATED_PATCH | CUTANEOUS | Status: DC
  Administered 2024-07-23: 1 via TRANSDERMAL
  Filled 2024-07-23: qty 1

## 2024-07-23 MED ORDER — LIDOCAINE 5 % EX PTCH: 1.0000 | MEDICATED_PATCH | CUTANEOUS | 0 refills | Status: DC

## 2024-07-23 MED ORDER — HYDROCODONE-ACETAMINOPHEN 5-325 MG PO TABS: 1.0000 | ORAL_TABLET | Freq: Four times a day (QID) | ORAL | 0 refills | Status: DC | PRN

## 2024-07-23 NOTE — ED Triage Notes (Signed)
 Pt reports left and right sided neck pain that started 2 days ago. Pt denies injury to the area.

## 2024-07-23 NOTE — Discharge Instructions (Signed)
 Evaluation for your neck pain was overall reassuring.  The CT scan did show interval worsening of the blockages in your right carotid artery.  Please follow-up with vascular surgery within the next couple weeks.  If you develop visual changes, numbness in your extremities or face or severe headache or any other concerning symptom please return to the ED for further evaluation.  This could also likely be muscular pain.  I sent Lidoderm  patches and Voltaren  gel along with a few tablets of Norco to your pharmacy for treatment.  Please follow-up your PCP.

## 2024-07-23 NOTE — ED Provider Notes (Signed)
 " [place] [facility] AT [facility] Provider Note   CSN: [id number] Arrival date & time: 07/23/24  1028     Patient presents with: Neck Pain  HPI Austin Mclaughlin is a 79 y.o. male with carotid artery disease, type 2 diabetes, hypertension, PAD presenting for neck pain.  Started 2 days ago.  Denies trauma.  States he is having difficulty moving his neck from left to right due to the pain.  Pain is mostly in the lateral aspects of the left and right sides of his neck.  He does have some tenderness with flexing extending his head as well.  Also reports 2 weeks of cough and nasal congestion.  He states those symptoms have improved and he denies fever.  Denies visual disturbance, denies paresthesia.  Denies any notable swelling in the neck.  Denies trouble breathing, difficulty swallowing or drooling.    Neck Pain      Prior to Admission medications  Medication Sig Start Date End Date Taking? Authorizing Provider  diclofenac  Sodium (VOLTAREN ) 1 % GEL Apply 2 g topically 4 (four) times daily for 19 days. 07/23/24 08/11/24 Yes Areej Tayler K, PA-C  HYDROcodone -acetaminophen  (NORCO/VICODIN) 5-325 MG tablet Take 1 tablet by mouth every 6 (six) hours as needed. 07/23/24  Yes Chen Holzman K, PA-C  lidocaine  (LIDODERM ) 5 % Place 1 patch onto the skin daily. Remove & Discard patch within 12 hours or as directed by MD 07/23/24  Yes Jeramy Dimmick K, PA-C  amLODipine -benazepril  (LOTREL) 10-40 MG capsule Take 1 capsule by mouth daily. 09/24/16   [provider]  aspirin  EC 81 MG EC tablet Take 1 tablet (81 mg total) by mouth daily. 02/17/13   Marcine Catalan M, PA-C  atorvastatin  (LIPITOR) 80 MG tablet Take 80 mg by mouth daily.  12/31/16   [provider]  cilostazol  (PLETAL ) 50 MG tablet Take 1 tablet (50 mg total) by mouth 2 (two) times daily. 04/06/24   Court Dorn PARAS, MD  clopidogrel  (PLAVIX ) 75 MG tablet Take 75 mg by mouth daily.    [provider]   erythromycin  ophthalmic ointment Place a 1/2 inch ribbon of ointment into the right lower eyelid every 6hrs for 7 days. 04/09/24   Scott, Rocky SAILOR, PA-C  ezetimibe  (ZETIA ) 10 MG tablet TAKE 1 TABLET(10 MG) BY MOUTH DAILY 03/26/23   Court Dorn PARAS, MD  hydrochlorothiazide  (HYDRODIURIL ) 25 MG tablet Take 25 mg by mouth daily. 11/05/15   [provider]  Insulin  Glargine-Lixisenatide 100-33 UNT-MCG/ML SOPN Inject 40 Units into the skin daily.    [provider]  magnesium  oxide (MAG-OX) 400 MG tablet Take 400 mg by mouth daily.    [provider]  metoprolol  succinate (TOPROL -XL) 25 MG 24 hr tablet TAKE 1 TABLET(25 MG) BY MOUTH DAILY 01/15/18   Court Dorn PARAS, MD  nitroGLYCERIN  (NITROSTAT ) 0.4 MG SL tablet Place 1 tablet (0.4 mg total) under the tongue every 5 (five) minutes x 3 doses as needed for chest pain. 02/17/13   Marcine Catalan M, PA-C  pantoprazole  (PROTONIX ) 40 MG tablet Take 1 tablet by mouth daily. 09/18/17   [provider]  Potassium Chloride  ER 20 MEQ TBCR Take 20 mEq by mouth daily. 01/23/17   [provider]    Allergies: Patient has no known allergies.    Review of Systems  Musculoskeletal:  Positive for neck pain.    Physical Exam   Vitals:   07/23/24 1034  BP: (!) 180/89  Pulse: 85  Resp:  16  Temp: 98.3 F (36.8 C)  SpO2: 97%    CONSTITUTIONAL:  well-appearing, NAD NEURO:  Alert and oriented x 3, CN 3-12 grossly intact EYES:  eyes equal and reactive ENT/NECK:  Supple, no stridor, no bruit, limited mobility with active rotation of the neck left and right due to pain also pain elicited flexion extension of the neck but able.  No tenderness with palpation of the posterior midline of the neck.  No lymphadenopathy.  No notable erythema edema or ecchymosis.  Oropharynx appears normal. CARDIO:  regular rate and rhythm, appears well-perfused  PULM:  No respiratory distress, CTAB GI/GU:  non-distended MSK/SPINE:  No gross  deformities, no edema, moves all extremities  SKIN:  no rash, atraumatic  *Additional and/or pertinent findings included in MDM below   (all labs ordered are listed, but only abnormal results are displayed) Labs Reviewed  COMPREHENSIVE METABOLIC PANEL WITH GFR - Abnormal; Notable for the following components:      Result Value   Glucose, Bld 331 (*)    All other components within normal limits  CBC WITH DIFFERENTIAL/PLATELET - Abnormal; Notable for the following components:   RBC 3.93 (*)    Hemoglobin 12.1 (*)    HCT 35.4 (*)    All other components within normal limits  RESP PANEL BY RT-PCR (RSV, FLU A&B, COVID)  [id number]    EKG: None  Radiology: CT Angio Head Neck W WO CM Result Date: 07/23/2024 EXAM: CTA Head and Neck with Intravenous Contrast. CT Head without Contrast. CLINICAL HISTORY: neck pain, h/o carotid artery disease TECHNIQUE: Axial CTA images of the head and neck performed with intravenous contrast. MIP reconstructed images were created and reviewed. Axial computed tomography images of the head/brain performed without intravenous contrast. Note: Per PQRS, the description of internal carotid artery percent stenosis, including 0 percent or normal exam, is based on [employer] Symptomatic Carotid Endarterectomy Trial (NASCET) criteria. Dose reduction technique was used including one or more of the following: automated exposure control, adjustment of mA and kV according to patient size, and/or iterative reconstruction. CONTRAST: Without and with; 75 mL iohexol  (OMNIPAQUE ) 350 MG/ML injection. COMPARISON: CT head without contrast 07/24/2014. FINDINGS: CT HEAD: BRAIN: Subcortical white matter hypoattenuation is mildly advanced for age, advanced since the prior exam. No acute intraparenchymal hemorrhage. No mass lesion. No CT evidence for acute territorial infarct. No midline shift or extra-axial collection. VENTRICLES: No hydrocephalus. ORBITS: Bilateral lens replacements are noted.  The globes and orbits are otherwise within normal limits. SINUSES AND MASTOIDS: The paranasal sinuses and mastoid air cells are clear. CTA NECK: COMMON CAROTID ARTERIES: Atherosclerotic changes are present at the aortic arch and great vessel origins without focal stenosis. Common origin of the left common carotid artery in the innominate artery is noted. Extensive mural calcifications are present throughout the course of the right common carotid artery without a significant stenosis of greater than 50%. Diffuse mural calcifications are present within the left common carotid artery and at the left carotid bifurcation without a significant stenosis of greater than 50%. INTERNAL CAROTID ARTERIES: Dense atherosclerotic calcifications are present at the right carotid bifurcation and proximal right ICA. A high-grade, near occlusive stenosis is present in the right ICA approximately 1 cm from the bifurcation. The more distal right ICA is smaller than on the left but otherwise within normal limits. At the left carotid bifurcation, there is diffuse mural calcification without a significant stenosis of greater than 50% in the proximal left ICA. Diffuse atherosclerotic calcifications are  present within the cavernous internal carotid arteries bilaterally without significant stenosis. VERTEBRAL ARTERIES: The right vertebral artery is dominant. High-grade calcific stenosis is present in the left V4 segment at the left dural margin. The left V4 segment is near completely occluded. No dissection or occlusion of the right vertebral artery. CTA HEAD: ANTERIOR CEREBRAL ARTERIES: No significant stenosis. No occlusion. No aneurysm. MIDDLE CEREBRAL ARTERIES: No significant stenosis. No occlusion. No aneurysm. POSTERIOR CEREBRAL ARTERIES: No significant stenosis. No occlusion. No aneurysm. BASILAR ARTERY: No significant stenosis. No occlusion. No aneurysm. OTHER: SOFT TISSUES: No acute finding. No masses or lymphadenopathy. BONES:  Straightening of the normal cervical lordosis is present. No acute osseous abnormality. IMPRESSION: 1. High-grade, near occlusive stenosis in the right ICA approximately 1 cm from the bifurcation. 2. Near complete occlusion of the left V4 segment at the left dural margin due to high-grade calcific stenosis. 3. Mildly advanced subcortical white matter hypoattenuation, progressed since the prior exam. Electronically signed by: Lonni Necessary MD 07/23/2024 11:55 AM EST RP Workstation: [id number]     Procedures   Medications Ordered in the ED  lidocaine  (LIDODERM ) 5 % 1 patch (has no administration in time range)  HYDROcodone -acetaminophen  (NORCO/VICODIN) 5-325 MG per tablet 1 tablet (1 tablet Oral Given 07/23/24 1106)  ondansetron  (ZOFRAN -ODT) disintegrating tablet 8 mg (8 mg Oral Given 07/23/24 1105)  iohexol  (OMNIPAQUE ) 350 MG/ML injection 75 mL (75 mLs Intravenous Contrast Given 07/23/24 1137)                                    Medical Decision Making Amount and/or Complexity of Data Reviewed Labs: ordered. Radiology: ordered.  Risk Prescription drug management.   Initial Impression and Ddx 79 yo well appearing male presenting for neck pain. Exam notable to limitation in mobility of his neck d/t pain. Ddx includes meningitis, torticollis, carotid artery dissection or occlusion, cellulitis, peritonsillar abscess.  Retropharyngeal abscess, other. Patient PMH that increases complexity of ED encounter:  carotid artery disease, type 2 diabetes, hypertension, PAD   Interpretation of Diagnostics - I independent reviewed and interpreted the labs as followed: BG 331, normal gap, WBC normal  - I independently visualized the following imaging with scope of interpretation limited to determining acute life threatening conditions related to emergency care: CT angio, which revealed CT angio head and neck revealed interval worsening of stenosis of right carotid and left V4 segment.  See details  above.  Shared findings with patient  Patient Reassessment and Ultimate Disposition/Management Discussed patient with Dr. Serene.  He advised outpatient follow-up within the next couple weeks and would be coordinating with his office to secure an earlier appointment for him.  Stated pain unlikely related.  Considered meningitis but feel its unlikely given that he is able to range his neck, no white count looks well and lack of other meningismal symptoms.  Also localized infection seems unlikely as well given how well he appears without associated symptoms and no evidence of infection superficially.  Discussed pertinent return precautions.  Advised him to follow-up with vascular surgery.  Pain was improved after treatment.  Sent a few tablets of Norco to his pharmacy along with Voltaren  gel and Lidoderm  patches.  Discharged in good condition.  Also advised to follow-up with his PCP as this could be muscular pain.  Patient management required discussion with the following services or consulting groups:  None  Complexity of Problems Addressed Acute complicated illness or Injury  Additional Data Reviewed and Analyzed Further history obtained from: Past medical history and medications listed in the EMR and Prior ED visit notes  Patient Encounter Risk Assessment Consideration of hospitalization      Final diagnoses:  Neck pain    ED Discharge Orders          Ordered    diclofenac  Sodium (VOLTAREN ) 1 % GEL  4 times daily        07/23/24 1312    lidocaine  (LIDODERM ) 5 %  Every 24 hours        07/23/24 1312    HYDROcodone -acetaminophen  (NORCO/VICODIN) 5-325 MG tablet  Every 6 hours PRN        07/23/24 1312               Wendell Nicoson K, PA-C 07/23/24 1315    Ula Prentice SAUNDERS, MD 07/23/24 1452  "

## 2024-07-23 NOTE — ED Notes (Signed)
 Pt has not had bp meds this morning.

## 2024-07-25 ENCOUNTER — Telehealth: Payer: Self-pay | Admitting: Cardiovascular Disease

## 2024-07-25 DIAGNOSIS — I6521 Occlusion and stenosis of right carotid artery: Secondary | ICD-10-CM

## 2024-07-25 DIAGNOSIS — E785 Hyperlipidemia, unspecified: Secondary | ICD-10-CM

## 2024-07-25 DIAGNOSIS — I739 Peripheral vascular disease, unspecified: Secondary | ICD-10-CM

## 2024-07-25 DIAGNOSIS — I2510 Atherosclerotic heart disease of native coronary artery without angina pectoris: Secondary | ICD-10-CM

## 2024-07-25 NOTE — Telephone Encounter (Signed)
 Calling to speak with the nurse about his recent hospital visit. Please advise

## 2024-07-25 NOTE — Telephone Encounter (Signed)
 Made pt aware that referral was place with Dr Gretta at [facility].

## 2024-07-25 NOTE — Telephone Encounter (Signed)
 Patient reports going to the hospital this weekend for right sided neck pain. Patient states the hospital reported valves in the neck are stopped up and recommended follow up with cardiology. Review of hospital ED notes shows a CT was performed with instructions to follow up with vascular surgery. Patient was discharged with pain medications and a lidocaine  patch, which provide some relief, but soreness persists. Denies any other symptoms. Advised patient that concerns will be forwarded to Dr. Court for further guidance.

## 2024-08-08 ENCOUNTER — Encounter: Admitting: Surgery

## 2024-08-11 ENCOUNTER — Telehealth (HOSPITAL_BASED_OUTPATIENT_CLINIC_OR_DEPARTMENT_OTHER): Payer: Self-pay

## 2024-08-11 ENCOUNTER — Ambulatory Visit: Admitting: Vascular Surgery

## 2024-08-11 ENCOUNTER — Encounter (HOSPITAL_BASED_OUTPATIENT_CLINIC_OR_DEPARTMENT_OTHER): Payer: Self-pay | Admitting: Cardiovascular Disease

## 2024-08-11 ENCOUNTER — Encounter: Payer: Self-pay | Admitting: Vascular Surgery

## 2024-08-11 ENCOUNTER — Other Ambulatory Visit (HOSPITAL_BASED_OUTPATIENT_CLINIC_OR_DEPARTMENT_OTHER): Payer: Self-pay | Admitting: Cardiovascular Disease

## 2024-08-11 VITALS — BP 167/84 | HR 82 | Temp 98.4°F | Resp 18 | Ht 64.0 in | Wt 172.2 lb

## 2024-08-11 DIAGNOSIS — I6521 Occlusion and stenosis of right carotid artery: Secondary | ICD-10-CM | POA: Diagnosis not present

## 2024-08-11 DIAGNOSIS — I2510 Atherosclerotic heart disease of native coronary artery without angina pectoris: Secondary | ICD-10-CM

## 2024-08-11 NOTE — Telephone Encounter (Signed)
 Patient has been scheduled for tomorrow, Feb, 6, 2026 for both tests. Patient is aware. Olam just needs for Dr. Court to sign consent form asap, please.

## 2024-08-11 NOTE — Telephone Encounter (Signed)
" ° °  Name: Austin Mclaughlin  DOB: 12/10/1945  MRN: [id number]  Primary Cardiologist: Dorn Lesches, MD  Chart reviewed as part of pre-operative protocol coverage. Because of Austin Mclaughlin's past medical history and time since last visit, he will require a follow-up in-office visit in order to better assess preoperative cardiovascular risk.  Pre-op covering staff: - Please schedule appointment and call patient to inform them. If patient already had an upcoming appointment within acceptable timeframe, please add pre-op clearance to the appointment notes so provider is aware. - Please contact requesting surgeon's office via preferred method (i.e, phone, fax) to inform them of need for appointment prior to surgery.  I will reach out to Dr. Lesches to discuss aspirin  and Plavix  hold since he is on these medications for his PAD.  Austin LOISE Fabry, PA-C  08/11/2024, 12:15 PM   "

## 2024-08-11 NOTE — Telephone Encounter (Signed)
"  ° °  Pre-operative Risk Assessment    Patient Name: Austin Mclaughlin  DOB: [birth year] MRN: [id number]   Date of last office visit: 04/06/2024 with Dr. Court Date of next office visit: None  Request for Surgical Clearance    Procedure:  Right CEA - Needs to be scheduled ASAP  Date of Surgery:  Clearance TBD - Needs to be scheduled  ASAP                                Surgeon:  Dr. Lanis Socks Group or Practice Name:  [facility]  Phone number:  [phone] Fax number:  [phone]   Type of Clearance Requested:   - Medical  - Pharmacy:  Hold Aspirin  and Clopidogrel  (Plavix ) -does not specify   Type of Anesthesia:  General    Additional requests/questions:  None  SignedPatrcia Iverson CROME   08/11/2024, 11:54 AM   "

## 2024-08-11 NOTE — Telephone Encounter (Signed)
 I called and spoke with patient, I explained that he will need to have a Lexiscan and echo and then an office visit. Once those get scheduled, I can schedule his office visit.

## 2024-08-12 ENCOUNTER — Ambulatory Visit (HOSPITAL_COMMUNITY): Admission: RE | Admit: 2024-08-12

## 2024-08-12 ENCOUNTER — Inpatient Hospital Stay (HOSPITAL_COMMUNITY): Admission: RE | Admit: 2024-08-12 | Source: Ambulatory Visit | Attending: Internal Medicine

## 2024-08-12 ENCOUNTER — Ambulatory Visit: Payer: Self-pay | Admitting: Cardiovascular Disease

## 2024-08-12 DIAGNOSIS — I2510 Atherosclerotic heart disease of native coronary artery without angina pectoris: Secondary | ICD-10-CM

## 2024-08-12 DIAGNOSIS — I10 Essential (primary) hypertension: Secondary | ICD-10-CM

## 2024-08-12 DIAGNOSIS — R0602 Shortness of breath: Secondary | ICD-10-CM

## 2024-08-12 DIAGNOSIS — R079 Chest pain, unspecified: Secondary | ICD-10-CM

## 2024-08-12 LAB — ECHOCARDIOGRAM COMPLETE
AR max vel: 1.33 cm2
AV Area VTI: 1.42 cm2
AV Area mean vel: 1.36 cm2
AV Mean grad: 8 mmHg
AV Peak grad: 15.2 mmHg
Ao pk vel: 1.95 m/s
Area-P 1/2: 4.6 cm2
Height: 64 in
S' Lateral: 2.5 cm
Weight: 2752 [oz_av]

## 2024-08-12 LAB — MYOCARDIAL PERFUSION IMAGING: Rest Nuclear Isotope Dose: 10.2 mCi

## 2024-08-12 MED ORDER — TECHNETIUM TC 99M TETROFOSMIN IV KIT
10.2000 | PACK | Freq: Once | INTRAVENOUS | Status: AC | PRN
  Administered 2024-08-12: 10.2 via INTRAVENOUS

## 2024-08-18 ENCOUNTER — Ambulatory Visit (HOSPITAL_COMMUNITY)

## 2024-08-25 ENCOUNTER — Ambulatory Visit: Admitting: Vascular Surgery
